# Patient Record
Sex: Male | Born: 1995 | Race: White | Hispanic: No | Marital: Single | Smoking: Never smoker
Health system: Southern US, Academic
[De-identification: ages and names within clinical notes are randomized; demographics above are authoritative.]

## PROBLEM LIST (undated history)

## (undated) ENCOUNTER — Emergency Department (HOSPITAL_COMMUNITY): Discharge status: Absent without leave | Payer: Self-pay

## (undated) DIAGNOSIS — J9383 Other pneumothorax: Secondary | ICD-10-CM

## (undated) HISTORY — PX: PLEURAL SCARIFICATION: SHX748

## (undated) HISTORY — DX: Other pneumothorax: J93.83

---

## 2015-08-14 DIAGNOSIS — J9383 Other pneumothorax: Secondary | ICD-10-CM

## 2015-08-14 HISTORY — DX: Other pneumothorax: J93.83

## 2015-08-18 ENCOUNTER — Inpatient Hospital Stay (HOSPITAL_COMMUNITY)
Admission: EM | Admit: 2015-08-18 | Discharge: 2015-08-26 | DRG: 164 | Disposition: A | Discharge status: Home / Self Care | Payer: 59 | Attending: Thoracic Surgery (Cardiothoracic Vascular Surgery) | Admitting: Thoracic Surgery (Cardiothoracic Vascular Surgery)

## 2015-08-18 ENCOUNTER — Inpatient Hospital Stay (HOSPITAL_COMMUNITY): Payer: 59

## 2015-08-18 ENCOUNTER — Encounter (HOSPITAL_COMMUNITY): Payer: Self-pay | Admitting: Cardiology

## 2015-08-18 ENCOUNTER — Emergency Department (HOSPITAL_COMMUNITY): Payer: 59

## 2015-08-18 DIAGNOSIS — J9382 Other air leak: Secondary | ICD-10-CM | POA: Diagnosis not present

## 2015-08-18 DIAGNOSIS — J9311 Primary spontaneous pneumothorax: Secondary | ICD-10-CM | POA: Diagnosis not present

## 2015-08-18 DIAGNOSIS — J939 Pneumothorax, unspecified: Secondary | ICD-10-CM

## 2015-08-18 DIAGNOSIS — J9383 Other pneumothorax: Secondary | ICD-10-CM | POA: Diagnosis present

## 2015-08-18 DIAGNOSIS — R0602 Shortness of breath: Secondary | ICD-10-CM | POA: Diagnosis present

## 2015-08-18 DIAGNOSIS — J930 Spontaneous tension pneumothorax: Secondary | ICD-10-CM

## 2015-08-18 DIAGNOSIS — J90 Pleural effusion, not elsewhere classified: Secondary | ICD-10-CM | POA: Diagnosis not present

## 2015-08-18 LAB — CBC
HCT: 45 % (ref 39.0–52.0)
Hemoglobin: 15.3 g/dL (ref 13.0–17.0)
MCH: 27.9 pg (ref 26.0–34.0)
MCHC: 34 g/dL (ref 30.0–36.0)
MCV: 82 fL (ref 78.0–100.0)
Platelets: 223 K/uL (ref 150–400)
RBC: 5.49 MIL/uL (ref 4.22–5.81)
RDW: 12.6 % (ref 11.5–15.5)
WBC: 7.9 K/uL (ref 4.0–10.5)

## 2015-08-18 LAB — CREATININE, SERUM
Creatinine, Ser: 1.18 mg/dL (ref 0.61–1.24)
GFR calc non Af Amer: >60 mL/min (ref >60)

## 2015-08-18 MED ORDER — MORPHINE SULFATE (PF) 4 MG/ML IV SOLN
4.0000 mg | Freq: Once | INTRAVENOUS | Status: AC
  Administered 2015-08-18: 4 mg via INTRAVENOUS
  Filled 2015-08-18: qty 1

## 2015-08-18 MED ORDER — ACETAMINOPHEN 325 MG PO TABS: 650.0000 mg | ORAL_TABLET | Freq: Four times a day (QID) | ORAL | Status: DC | PRN

## 2015-08-18 MED ORDER — TRAMADOL HCL 50 MG PO TABS
50.0000 mg | ORAL_TABLET | Freq: Four times a day (QID) | ORAL | Status: DC | PRN
  Administered 2015-08-18 – 2015-08-23 (×4): 100 mg via ORAL
  Filled 2015-08-18 (×4): qty 2

## 2015-08-18 MED ORDER — DIPHENHYDRAMINE HCL 50 MG/ML IJ SOLN: 12.5000 mg | Freq: Four times a day (QID) | INTRAMUSCULAR | Status: DC | PRN

## 2015-08-18 MED ORDER — OXYCODONE HCL 5 MG PO TABS
5.0000 mg | ORAL_TABLET | ORAL | Status: DC | PRN
  Administered 2015-08-18 – 2015-08-22 (×8): 5 mg via ORAL
  Filled 2015-08-18 (×8): qty 1

## 2015-08-18 MED ORDER — MIDAZOLAM HCL 2 MG/2ML IJ SOLN
4.0000 mg | Freq: Once | INTRAMUSCULAR | Status: DC
  Filled 2015-08-18: qty 4

## 2015-08-18 MED ORDER — DIPHENHYDRAMINE HCL 12.5 MG/5ML PO ELIX: 12.5000 mg | ORAL_SOLUTION | Freq: Four times a day (QID) | ORAL | Status: DC | PRN

## 2015-08-18 MED ORDER — SODIUM CHLORIDE 0.9 % IJ SOLN
3.0000 mL | Freq: Two times a day (BID) | INTRAMUSCULAR | Status: DC
  Administered 2015-08-18 – 2015-08-22 (×8): 3 mL via INTRAVENOUS

## 2015-08-18 MED ORDER — MORPHINE SULFATE (PF) 2 MG/ML IV SOLN: 2.0000 mg | INTRAVENOUS | Status: DC | PRN

## 2015-08-18 MED ORDER — ENOXAPARIN SODIUM 40 MG/0.4ML ~~LOC~~ SOLN
40.0000 mg | SUBCUTANEOUS | Status: DC
  Administered 2015-08-18 – 2015-08-22 (×5): 40 mg via SUBCUTANEOUS
  Filled 2015-08-18 (×5): qty 0.4

## 2015-08-18 MED ORDER — SODIUM CHLORIDE 0.9 % IJ SOLN
3.0000 mL | Freq: Two times a day (BID) | INTRAMUSCULAR | Status: DC
  Administered 2015-08-18 – 2015-08-22 (×4): 3 mL via INTRAVENOUS

## 2015-08-18 MED ORDER — NALOXONE HCL 0.4 MG/ML IJ SOLN: 0.4000 mg | INTRAMUSCULAR | Status: DC | PRN

## 2015-08-18 MED ORDER — ONDANSETRON HCL 4 MG PO TABS: 4.0000 mg | ORAL_TABLET | Freq: Four times a day (QID) | ORAL | Status: DC | PRN

## 2015-08-18 MED ORDER — FENTANYL 40 MCG/ML IV SOLN
INTRAVENOUS | Status: DC
  Filled 2015-08-18: qty 25

## 2015-08-18 MED ORDER — ACETAMINOPHEN 650 MG RE SUPP: 650.0000 mg | Freq: Four times a day (QID) | RECTAL | Status: DC | PRN

## 2015-08-18 MED ORDER — LIDOCAINE HCL (PF) 1 % IJ SOLN
30.0000 mL | Freq: Once | INTRAMUSCULAR | Status: AC
Start: 2015-08-18 — End: 2015-08-18
  Administered 2015-08-18: 30 mL
  Filled 2015-08-18: qty 30

## 2015-08-18 MED ORDER — SODIUM CHLORIDE 0.9 % IV SOLN: 250.0000 mL | INTRAVENOUS | Status: DC | PRN

## 2015-08-18 MED ORDER — MIDAZOLAM HCL 2 MG/2ML IJ SOLN
INTRAMUSCULAR | Status: AC | PRN
  Administered 2015-08-18: 2 mg via INTRAVENOUS

## 2015-08-18 MED ORDER — ONDANSETRON HCL 4 MG/2ML IJ SOLN: 4.0000 mg | Freq: Four times a day (QID) | INTRAMUSCULAR | Status: DC | PRN

## 2015-08-18 MED ORDER — SODIUM CHLORIDE 0.9 % IJ SOLN: 9.0000 mL | INTRAMUSCULAR | Status: DC | PRN

## 2015-08-18 MED ORDER — SODIUM CHLORIDE 0.9 % IJ SOLN: 3.0000 mL | INTRAMUSCULAR | Status: DC | PRN

## 2015-08-18 MED ORDER — ALUM & MAG HYDROXIDE-SIMETH 200-200-20 MG/5ML PO SUSP: 30.0000 mL | Freq: Four times a day (QID) | ORAL | Status: DC | PRN

## 2015-08-18 NOTE — ED Provider Notes (Signed)
CSN: [id number]     Arrival date & time 08/18/15  4098 History   First MD Initiated Contact with Patient 08/18/15 [phone]     Chief Complaint  Patient presents with  . Chest Injury     (Consider location/radiation/quality/duration/timing/severity/associated sxs/prior Treatment) HPI Patient presents to the emergency department with sudden onset of right-sided chest pain yesterday while in class.  He states he went to an urgent care today and was found to have a right-sided pneumothorax.  He was sent here to the emergency department with a report from that facility that just stated that he had a right-sided pneumothorax.  The patient states he did not have any injury or cough.  The patient denies weakness, dizziness, headache, blurred vision, back pain, neck pain, fever, cough, runny nose, sore throat, or syncope.  He should states nothing seems to make his condition, better or worse History reviewed. No pertinent past medical history. History reviewed. No pertinent past surgical history. History reviewed. No pertinent family history. Social History  Substance Use Topics  . Smoking status: Never Smoker   . Smokeless tobacco: None  . Alcohol Use: No    Review of Systems All other systems negative except as documented in the HPI. All pertinent positives and negatives as reviewed in the HPI.   Allergies  Review of patient's allergies indicates no known allergies.  Home Medications   Prior to Admission medications   Medication Sig Start Date End Date Taking? Authorizing Provider  ibuprofen (ADVIL,MOTRIN) 200 MG tablet Take 600 mg by mouth every 6 (six) hours as needed for moderate pain.   Yes Historical Provider, MD   BP 133/96 mmHg  Pulse 115  Temp(Src) 98.8 F (37.1 C) (Oral)  Resp 22  Ht 6' (1.829 m)  Wt 170 lb (77.111 kg)  BMI 23.05 kg/m2  SpO2 97% Physical Exam  Constitutional: He is oriented to person, place, and time. He appears well-developed and well-nourished. No  distress.  HENT:  Head: Normocephalic and atraumatic.  Right Ear: External ear normal.  Mouth/Throat: No oropharyngeal exudate.  Eyes: Pupils are equal, round, and reactive to light.  Neck: Normal range of motion. Neck supple.  Cardiovascular: Normal rate, regular rhythm and normal heart sounds.  Exam reveals no friction rub.   No murmur heard. Pulmonary/Chest: Effort normal. No tachypnea and no bradypnea. He has decreased breath sounds in the right upper field, the right middle field and the right lower field. He has no wheezes. He has no rhonchi. He has no rales.  Neurological: He is alert and oriented to person, place, and time. He exhibits normal muscle tone. Coordination normal.  Skin: Skin is warm and dry. No rash noted. No erythema.  Nursing note and vitals reviewed.   ED Course  Procedures (including critical care time) Labs Review Labs Reviewed - No data to display  Imaging Review Dg Chest 2 View  08/18/2015  CLINICAL DATA:  Chest pain EXAM: CHEST  2 VIEW COMPARISON:  None. FINDINGS: There is a complete pneumothorax on the right with tension component. Left lung clear. Heart size normal. Pulmonary vascularity on the left normal. No bone lesions. IMPRESSION: Complete pneumothorax on the right with tension component. Left lung clear. Critical Value/emergent results were called by telephone at the time of interpretation on 08/18/2015 at 10:38 am to [facility], [facility], who verbally acknowledged these results. Electronically Signed   By: Bretta Bang III M.D.   On: 08/18/2015 10:38   I have personally reviewed and evaluated these images  and lab results as part of my medical decision-making.     The thoracic surgeon was paged within an hour after the patient's arrival.  I spoke with an OR nurse and gave her the full details that the patient had a tension pneumothorax causing full collapse of his right lung.  She asked me what I would like for her to tell Dr. Dorris FetchHendrickson, and I  said, he needs to come down and see the patient as soon as possible for correction of the pneumothorax.  3:20 PM Dr. Dorris FetchHendrickson shows up and advises us that he just found out about this 5 minutes prior to coming to the ER.  I advised him that I spoke with the OR nurse who relayed the message to him and he needed to come down as soon as possible.    The patient has been stable the entire visit here in the ER.  Patient is advised plan and all questions were answered     Charlestine NightChristopher Chaquana Nichols, PA-C 08/18/15 1528  Mancel BaleElliott Wentz, MD 08/18/15 1911

## 2015-08-18 NOTE — ED Notes (Signed)
Called x-ray to inform that patient needs to be taken.

## 2015-08-18 NOTE — ED Notes (Signed)
Pt to department via EMS- pt reports sudden onset of right sided chest pain and SOB while in class yesterday. [facility] [address] and had a chest x-ray which showed a right complete pneumo. Bp-132/92 Hr-110 Sats-99

## 2015-08-18 NOTE — Sedation Documentation (Signed)
PA at bedside.

## 2015-08-18 NOTE — Procedures (Signed)
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 He was found to have a 100% right pneumothorax Chest tube placement indicated for survival benefit and relief of symptoms. After informed consent, chest was prepped and draped Given 4 mg morphine and 2 mg versed IV with good effect 20 ml of 1% lidocaine used for local anesthetic 28 F chest tube placed anterior right chest + rush of air. Air leak from tube after placement Tolerated well  Louis SpareSteven C. Dorris FetchHendrickson, MD [facility] [phone]

## 2015-08-18 NOTE — ED Notes (Signed)
Talked with PA about pt waiting for chest tube. Informed we are waiting for cardiothoracic MD to arrive.

## 2015-08-18 NOTE — ED Notes (Signed)
Talked with ED provider about pt waiting for chest tube. Pt remains stable at this time, denies any SOB. Skin warm and dry.

## 2015-08-18 NOTE — ED Notes (Signed)
Cardothoracic MD at bedside.

## 2015-08-18 NOTE — H&P (Signed)
[address]       [place] [id number]             [phone]             HISTORY AND PHYSICAL     Name: Louis LovettSimon A OZH-YQMVbu-Loiselle DOB: 1996-04-07 18 y.o. 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  There was associated shortness of breath.  The pain persisted and worsened with limited activity such as walking across campus.  He denies fever, chills, cough, nausea or vomiting. He ultimately presented to Urgent Care where a chest x-ray was performed, showing a large right pneumothorax.  [facility] and x-ray in the ER revealed a 100% right pneumothorax.  TCTS was consulted and Dr. Dorris FetchHendrickson saw the patient.  A right sided chest tube was placed while in the ER and the patient is now being admitted for chest tube management.    Past History: History reviewed. No pertinent past medical history.   Surgical History: History reviewed. No pertinent past surgical history.   Social History: Social History   Social History  . Marital Status: Single    Spouse Name: N/A  . Number of Children: N/A  . Years of Education: N/A   Social History Main Topics  . Smoking status: Never Smoker   . Smokeless tobacco: None  . Alcohol Use: No  . Drug Use: No  . Sexual Activity: Not Asked   Other Topics Concern  . None   Social History Narrative  . None    Family History: History reviewed. No pertinent family history.   Allergies: No Known Allergies   Medications: Prior to Admission medications   Medication Sig Start Date End Date Taking? Authorizing Provider  ibuprofen (ADVIL,MOTRIN) 200 MG tablet Take 600 mg by mouth every 6 (six) hours as needed for moderate pain.   Yes Historical Provider, MD     Review of Systems:             Cardiac:   Chest Pain [ x ]  Resting SOB [ x ] Exertional SOB  [ x ]  Orthopnea [  ]   Pedal Edema [   ]    Palpitations [  ] Syncope  [  ]   Presyncope [   ]  General: Constitional: recent weight change [  ]; anorexia [  ]; fatigue [  ]; nausea [  ]; night sweats [  ]; fever [  ]; or chills [  ];  Dental: poor dentition[  ]; Last Dentist visit:   Eye : blurred vision [  ]; diplopia [   ]; vision changes [  ];  Amaurosis fugax[  ]; Resp: cough [  ];  wheezing[  ];  hemoptysis[  ]; shortness of breath[ x ]; paroxysmal nocturnal dyspnea[  ]; dyspnea on exertion[  ]; or orthopnea[  ];  GI:  gallstones[  ], vomiting[  ];  dysphagia[  ]; melena[  ];  hematochezia [  ]; heartburn[  ];   Hx of  Colonoscopy[  ]; GU: kidney stones [  ]; hematuria[  ];   dysuria [  ];  nocturia[  ];  history of     obstruction [  ];             Skin: rash, swelling[  ];, hair loss[  ];  peripheral edema[  ];  or itching[  ]; Musculosketetal: myalgias[  ];  joint swelling[  ];  joint erythema[  ];  joint pain[  ];  back pain[  ];  Heme/Lymph: bruising[  ];  bleeding[  ];  anemia[  ];  Neuro: TIA[  ];  headaches[  ];  stroke[  ];  vertigo[  ];  seizures[  ];   paresthesias[  ];  difficulty walking[  ];  Psych:depression[  ]; anxiety[  ];  Endocrine: diabetes[  ];  thyroid dysfunction[  ];  Immunizations: Flu [  ]; Pneumococcal[  ];  Other:   Physical Exam: Filed Vitals:   08/18/15 1605  BP: 130/89  Pulse: 112  Temp:   Resp: 22    General: No acute distress Heart: Regular rate and rhythm without murmurs, rubs, or gallops Lungs: Clear on left, decreased breath sounds on right Abdomen: Soft, nontender, nondistended.  Bowel sounds active in all quadrants. Neurologic: Grossly intact.  No obvious deficits. Extremities: Warm, well perfused Chest tube: No obvious air  leak   Labs: No results found for: WBC, HGB, HCT, MCV, PLT No results found for: INR, PROTIME No results found for: NA, K, CL, CO2, GLUCOSE, BUN, CREATININE, CALCIUM, GFRNONAA, GFRAA  Imaging: Dg Chest 2 View  08/18/2015  CLINICAL DATA:  Chest pain EXAM: CHEST  2 VIEW COMPARISON:  None. FINDINGS: There is a complete pneumothorax on the right with tension component. Left lung clear. Heart size normal. Pulmonary vascularity on the left normal. No bone lesions. IMPRESSION: Complete pneumothorax on the right with tension component. Left lung clear. Critical Value/emergent results were called by telephone at the time of interpretation on 08/18/2015 at 10:38 am to [facility], [facility], who verbally acknowledged these results. Electronically Signed   By: Bretta Bang III M.D.   On: 08/18/2015 10:38     Assessment/Plan: The patient is a 19 year old male with no significant past medical history who presents with a spontaneous right pneumothorax.  A chest tube has been placed and the patient is being admitted for chest tube management.    Adella Hare, PA-C 08/18/2015 4:13 PM   Patient seen and examined, agree with above. 19 yo male nonsmoker developed CP and SOB yesterday. Came to ED this AM- CXR showed a 100% pneumothorax.  Salvatore Decent Dorris Fetch, MD [facility] [phone]

## 2015-08-19 ENCOUNTER — Inpatient Hospital Stay (HOSPITAL_COMMUNITY): Payer: 59

## 2015-08-19 LAB — COMPREHENSIVE METABOLIC PANEL
ALBUMIN: 4.1 g/dL (ref 3.5–5.0)
ALK PHOS: 57 U/L (ref 38–126)
ALT: 16 U/L — AB (ref 17–63)
AST: 17 U/L (ref 15–41)
Anion gap: 11 (ref 5–15)
BILIRUBIN TOTAL: 1.1 mg/dL (ref 0.3–1.2)
BUN: 15 mg/dL (ref 6–20)
CALCIUM: 9.9 mg/dL (ref 8.9–10.3)
CO2: 28 mmol/L (ref 22–32)
CREATININE: 1.25 mg/dL — AB (ref 0.61–1.24)
Chloride: 101 mmol/L (ref 101–111)
GFR calc Af Amer: >60 mL/min (ref >60)
GFR calc non Af Amer: >60 mL/min (ref >60)
GLUCOSE: 103 mg/dL — AB (ref 65–99)
Potassium: 3.9 mmol/L (ref 3.5–5.1)
SODIUM: 140 mmol/L (ref 135–145)
TOTAL PROTEIN: 7.2 g/dL (ref 6.5–8.1)

## 2015-08-19 MED ORDER — INFLUENZA VAC SPLIT QUAD 0.5 ML IM SUSY
0.5000 mL | PREFILLED_SYRINGE | INTRAMUSCULAR | Status: DC
  Filled 2015-08-19: qty 0.5

## 2015-08-19 NOTE — Progress Notes (Addendum)
[address]       [place] [address]             [phone]               Subjective: Sore at CT site but controlled with po meds. Breathing stable.   Objective: Vital signs in last 24 hours: Patient Vitals for the past 24 hrs:  BP Temp Temp src Pulse Resp SpO2 Height Weight  08/19/15 0620 124/82 mmHg 97.6 F (36.4 C) Oral 85 18 100 % - -  08/18/15 2019 124/72 mmHg 98 F (36.7 C) Oral (!) 109 18 99 % - -  08/18/15 1836 133/74 mmHg 97.9 F (36.6 C) Oral (!) 106 20 99 % 6' (1.829 m) 174 lb 4.8 oz (79.062 kg)  08/18/15 1715 124/84 mmHg - - 86 22 97 % - -  08/18/15 1700 131/95 mmHg - - 85 21 99 % - -  08/18/15 1645 128/94 mmHg - - 86 (!) 30 99 % - -  08/18/15 1630 128/88 mmHg - - 89 25 96 % - -  08/18/15 1620 126/91 mmHg - Oral 95 25 100 % - -  08/18/15 1615 126/91 mmHg - - 89 25 100 % - -  08/18/15 1605 130/89 mmHg - - 112 22 100 % - -  08/18/15 1600 140/93 mmHg - - 111 24 100 % - -  08/18/15 1600 140/93 mmHg - - 110 24 100 % - -  08/18/15 1555 142/96 mmHg - - 114 23 100 % - -  08/18/15 1545 148/96 mmHg - - 106 23 98 % - -  08/18/15 1538 132/98 mmHg - - 98 (!) 28 97 % - -  08/18/15 1530 132/98 mmHg - - 102 (!) 28 100 % - -  08/18/15 1515 133/96 mmHg - - 115 22 97 % - -  08/18/15 1500 110/70 mmHg - - 90 24 97 % - -  08/18/15 1445 121/72 mmHg - - 95 19 95 % - -  08/18/15 1430 100/69 mmHg - - 92 24 97 % - -  08/18/15 1415 111/71 mmHg - - 87 18 95 % - -  08/18/15 1345 114/83 mmHg - - 95 23 98 % - -  08/18/15 1315 107/75 mmHg - - 86 24 96 % - -  08/18/15 1245 124/87 mmHg - - 99 22 97 % - -  08/18/15 1114 125/86 mmHg - - 101 22 98 % - -  08/18/15 1015 126/87 mmHg - - 91 25 95 % - -  08/18/15 1000 130/96 mmHg - - 94 (!) 27 97 % - -  08/18/15 0945 127/88 mmHg - - 100 24 97 % - -  08/18/15 0934 (!) 141/114 mmHg 98.8 F (37.1 C) Oral 113 20 96 % 6' (1.829 m) 170 lb (77.111 kg)   Current Weight  08/18/15 174 lb 4.8 oz (79.062 kg)      Intake/Output from previous day: 11/11 0701 - 11/12 0700 In: 580 [P.O.:480; I.V.:100] Out: 0     PHYSICAL EXAM:  Heart: RRR Lungs: Clear Chest tube: + air leak with cough    Lab Results: CBC: Recent Labs  08/18/15 1717  WBC 7.9  HGB 15.3  HCT 45.0  PLT 223   BMET:  Recent Labs  08/18/15 1717 08/19/15 0432  NA  --  140  K  --  3.9  CL  --  101  CO2  --  28  GLUCOSE  --  103*  BUN  --  15  CREATININE 1.18 1.25*  CALCIUM  --  9.9    PT/INR: No results for input(s): LABPROT, INR in the last 72 hours.    Assessment/Plan: S/P  R CT for spontaneous ptx- CXR improved with small residual ptx.  + air leak with cough.  CT was never placed on suction overnight. Will continue CT to water seal for now. Follow up CXR in am.    LOS: 1 day    COLLINS,GINA H 08/19/2015  He has an air leak with cough- will leave on water seal today  Viviann SpareSteven C. Dorris FetchHendrickson, MD [facility] [phone]

## 2015-08-20 ENCOUNTER — Inpatient Hospital Stay (HOSPITAL_COMMUNITY): Payer: 59

## 2015-08-20 NOTE — Progress Notes (Addendum)
       [address]       [place] [id number]             [phone]               Subjective: Breathing stable, pain controlled.    Objective: Vital signs in last 24 hours: Patient Vitals for the past 24 hrs:  BP Temp Temp src Pulse Resp SpO2  08/20/15 0550 (!) 147/91 mmHg 97.5 F (36.4 C) Oral 89 18 97 %  08/19/15 2113 130/77 mmHg 97.5 F (36.4 C) Oral 98 18 95 %  08/19/15 2055 - - - - - 96 %  08/19/15 1422 128/85 mmHg - Oral 72 19 99 %   Current Weight  08/18/15 174 lb 4.8 oz (79.062 kg)     Intake/Output from previous day: 11/12 0701 - 11/13 0700 In: 480 [P.O.:480] Out: -     PHYSICAL EXAM:  Heart: RRR Lungs: Decreased BS on R Chest tube: 1/7 air leak on suction    Lab Results: CBC: Recent Labs  08/18/15 1717  WBC 7.9  HGB 15.3  HCT 45.0  PLT 223   BMET:  Recent Labs  08/18/15 1717 08/19/15 0432  NA  --  140  K  --  3.9  CL  --  101  CO2  --  28  GLUCOSE  --  103*  BUN  --  15  CREATININE 1.18 1.25*  CALCIUM  --  9.9    PT/INR: No results for input(s): LABPROT, INR in the last 72 hours.  CXR: FINDINGS: A right thoracostomy tube is again identified. The right pneumothorax has increased in size, now large. There is no evidence of mediastinal shift.  Right basilar atelectasis again noted.  The cardiomediastinal silhouette is unremarkable.  The left lung is clear.  IMPRESSION: Increased size of right pneumothorax, now large. No mediastinal shift. Right thoracostomy tube appears unchanged.  Results were called by telephone at the time of interpretation on 08/20/2015 at 7:51 am to Fayrene FearingJames, nurse for this patient, who verbally acknowledged these results.    Assessment/Plan: S/P  R CT for spontaneous ptx- CXR with enlarged ptx today, CT placed on -20 cm suction.  Follow up cxr in am.   LOS: 2 days    COLLINS,GINA H 08/20/2015  Lung failed to fully reexpand on water seal Will add suction as noted  above  Viviann SpareSteven C. Dorris FetchHendrickson, MD [facility] [phone]

## 2015-08-21 ENCOUNTER — Inpatient Hospital Stay (HOSPITAL_COMMUNITY): Payer: 59

## 2015-08-21 NOTE — Progress Notes (Signed)
UR Completed. Exa Bomba, RN, BSN.  [phone] 

## 2015-08-21 NOTE — Progress Notes (Addendum)
[name]       [name] [address]             [phone]          Subjective: Not SOB  Objective: Vital signs in last 24 hours: Temp:  [98 F (36.7 C)-99.2 F (37.3 C)] 98.6 F (37 C) (11/14 0547) Pulse Rate:  [80-107] 96 (11/14 0547) Cardiac Rhythm:  [-] Normal sinus rhythm (11/14 0739) Resp:  [17-18] 18 (11/14 0547) BP: (136-144)/(83-87) 136/85 mmHg (11/14 0547) SpO2:  [95 %-98 %] 95 % (11/14 0547)  Hemodynamic parameters for last 24 hours:    Intake/Output from previous day: 11/13 0701 - 11/14 0700 In: 240 [P.O.:240] Out: 0  Intake/Output this shift:    General appearance: alert, cooperative and no distress Heart: regular rate and rhythm Lungs: clear to auscultation bilaterally  Lab Results:  Recent Labs  08/18/15 1717  WBC 7.9  HGB 15.3  HCT 45.0  PLT 223   BMET:  Recent Labs  08/18/15 1717 08/19/15 0432  NA  --  140  K  --  3.9  CL  --  101  CO2  --  28  GLUCOSE  --  103*  BUN  --  15  CREATININE 1.18 1.25*  CALCIUM  --  9.9    PT/INR: No results for input(s): LABPROT, INR in the last 72 hours. ABG No results found for: PHART, HCO3, TCO2, ACIDBASEDEF, O2SAT CBG (last 3)  No results for input(s): GLUCAP in the last 72 hours.  Meds Scheduled Meds: . enoxaparin (LOVENOX) injection  40 mg Subcutaneous Q24H  . Influenza vac split quadrivalent PF  0.5 mL Intramuscular Tomorrow-1000  . sodium chloride  3 mL Intravenous Q12H  . sodium chloride  3 mL Intravenous Q12H   Continuous Infusions:  PRN Meds:.sodium chloride, acetaminophen **OR** acetaminophen, alum & mag hydroxide-simeth, ondansetron **OR** ondansetron (ZOFRAN) IV, oxyCODONE, sodium chloride, traMADol  Xrays Dg Chest Port 1 View  08/21/2015  CLINICAL DATA:  Follow-up pneumothorax, chest tube treatment. EXAM: PORTABLE CHEST 1 VIEW COMPARISON:  Portable chest x-ray of August 20, 2015 FINDINGS: There has been interval re-expansion of the right lung. A  less than 5% apical pneumothorax persists. There is small right pleural effusion and minimal right basilar atelectasis. The right-sided chest tube tip projects over the posterior aspect of the third rib. There is no mediastinal shift. The left lung is clear. The heart and pulmonary vascularity are normal the bony thorax is unremarkable. IMPRESSION: Interval near total re-expansion of the right lung following chest tube placement. A less than 5% pneumothorax remains. There is right basilar subsegmental atelectasis and small right pleural effusion. Electronically Signed   By: David  [place] M.D.   On: 08/21/2015 07:55   Dg Chest Port 1 View  08/20/2015  CLINICAL DATA:  19 year old male with spontaneous pneumothorax. EXAM: PORTABLE CHEST 1 VIEW COMPARISON:  None. FINDINGS: A right thoracostomy tube is again identified. The right pneumothorax has increased in size, now large. There is no evidence of mediastinal shift. Right basilar atelectasis again noted. The cardiomediastinal silhouette is unremarkable. The left lung is clear. IMPRESSION: Increased size of right pneumothorax, now large. No mediastinal shift. Right thoracostomy tube appears unchanged. Results were called by telephone at the time of interpretation on 08/20/2015 at 7:51 am to Fayrene Fearing, nurse for this patient, who verbally acknowledged these results. Electronically Signed   By: Harmon Pier M.D.   On: 08/20/2015 07:53    Assessment/Plan:  1 small pntx (5%) and small intermit air leak- cont to suction at 20 cm H20 for now    LOS: 3 days    Louis [name],Louis [name] 08/21/2015  Patient seen and examined, agree with above Chest xray better on suction Still has an air leak- if doesn't resolve by tomorrow will consider VATS  Viviann SpareSteven C. Dorris FetchHendrickson, MD [facility] [phone]

## 2015-08-22 ENCOUNTER — Inpatient Hospital Stay (HOSPITAL_COMMUNITY): Payer: 59

## 2015-08-22 LAB — TYPE AND SCREEN
ABO/RH(D): O POS
ANTIBODY SCREEN: NEGATIVE

## 2015-08-22 MED ORDER — DEXTROSE 5 % IV SOLN
1.5000 g | INTRAVENOUS | Status: AC
  Administered 2015-08-23: 1.5 g via INTRAVENOUS
  Filled 2015-08-22 (×2): qty 1.5

## 2015-08-22 NOTE — Progress Notes (Addendum)
      [name]       [place] [id number]             [phone]          Subjective: Feels pretty well, not SOB  Objective: Vital signs in last 24 hours: Temp:  [97.8 F (36.6 C)] 97.8 F (36.6 C) (11/15 0357) Pulse Rate:  [76-92] 82 (11/15 0357) Cardiac Rhythm:  [-] Normal sinus rhythm (11/15 0700) Resp:  [18-20] 18 (11/15 0357) BP: (124-146)/(81-97) 124/84 mmHg (11/15 0357) SpO2:  [97 %-98 %] 97 % (11/15 0357)  Hemodynamic parameters for last 24 hours:    Intake/Output from previous day: 11/14 0701 - 11/15 0700 In: 480 [P.O.:480] Out: 10 [Chest Tube:10] Intake/Output this shift:    General appearance: alert, cooperative and no distress Heart: regular rate and rhythm Lungs: clear to auscultation bilaterally Abdomen: benign  Lab Results: No results for input(s): WBC, HGB, HCT, PLT in the last 72 hours. BMET: No results for input(s): NA, K, CL, CO2, GLUCOSE, BUN, CREATININE, CALCIUM in the last 72 hours.  PT/INR: No results for input(s): LABPROT, INR in the last 72 hours. ABG No results found for: PHART, HCO3, TCO2, ACIDBASEDEF, O2SAT CBG (last 3)  No results for input(s): GLUCAP in the last 72 hours.  Meds Scheduled Meds: . enoxaparin (LOVENOX) injection  40 mg Subcutaneous Q24H  . Influenza vac split quadrivalent PF  0.5 mL Intramuscular Tomorrow-1000  . sodium chloride  3 mL Intravenous Q12H  . sodium chloride  3 mL Intravenous Q12H   Continuous Infusions:  PRN Meds:.sodium chloride, acetaminophen **OR** acetaminophen, alum & mag hydroxide-simeth, ondansetron **OR** ondansetron (ZOFRAN) IV, oxyCODONE, sodium chloride, traMADol  Xrays Dg Chest Port 1 View  08/21/2015  CLINICAL DATA:  Follow-up pneumothorax, chest tube treatment. EXAM: PORTABLE CHEST 1 VIEW COMPARISON:  Portable chest x-ray of August 20, 2015 FINDINGS: There has been interval re-expansion of the right lung. A less than 5% apical pneumothorax persists. There is small  right pleural effusion and minimal right basilar atelectasis. The right-sided chest tube tip projects over the posterior aspect of the third rib. There is no mediastinal shift. The left lung is clear. The heart and pulmonary vascularity are normal the bony thorax is unremarkable. IMPRESSION: Interval near total re-expansion of the right lung following chest tube placement. A less than 5% pneumothorax remains. There is right basilar subsegmental atelectasis and small right pleural effusion. Electronically Signed   By: David  [place] M.D.   On: 08/21/2015 07:55    Assessment/Plan:  1 stable, CXR is pending, + air leak(small) persists   LOS: 4 days    Louis [name],Louis [name] 08/22/2015  Patient seen and examined, agree with above He still has a small air leak At this point will give him one more day for air leak to resolve. If doesn't resolve by tomorrow will plan to do VATS, blebectomy tomorrow afternoon I informed him of the general nature of the procedure including the need for general anesthesia, the incisions to be used, the expected hospital stay and overall recovery I reviewed the indications, risks, benefits and alternatives. He understands the risks include but are not limited to death, DVT/PE, bleeding, infection, air leak and recurrent pneumothorax. He agrees to proceed.  Salvatore DecentSteven C. Dorris FetchHendrickson, MD [facility] [phone]

## 2015-08-23 ENCOUNTER — Inpatient Hospital Stay (HOSPITAL_COMMUNITY): Payer: 59 | Admitting: Certified Registered"

## 2015-08-23 ENCOUNTER — Inpatient Hospital Stay (HOSPITAL_COMMUNITY): Payer: 59

## 2015-08-23 ENCOUNTER — Encounter (HOSPITAL_COMMUNITY): Payer: Self-pay | Admitting: Certified Registered"

## 2015-08-23 ENCOUNTER — Encounter (HOSPITAL_COMMUNITY)
Admission: EM | Disposition: A | Discharge status: Home / Self Care | Payer: Self-pay | Source: Home / Self Care | Attending: Thoracic Surgery (Cardiothoracic Vascular Surgery)

## 2015-08-23 HISTORY — PX: PLEURAL BIOPSY: SHX5082

## 2015-08-23 HISTORY — PX: STAPLING OF BLEBS: SHX6429

## 2015-08-23 HISTORY — PX: VIDEO ASSISTED THORACOSCOPY: SHX5073

## 2015-08-23 LAB — MRSA PCR SCREENING: MRSA BY PCR: NEGATIVE

## 2015-08-23 LAB — ABO/RH: ABO/RH(D): O POS

## 2015-08-23 SURGERY — VIDEO ASSISTED THORACOSCOPY
Anesthesia: General | Site: Chest | Laterality: Right

## 2015-08-23 MED ORDER — 0.9 % SODIUM CHLORIDE (POUR BTL) OPTIME
TOPICAL | Status: DC | PRN
  Administered 2015-08-23: 2000 mL

## 2015-08-23 MED ORDER — SENNOSIDES-DOCUSATE SODIUM 8.6-50 MG PO TABS
1.0000 | ORAL_TABLET | Freq: Every day | ORAL | Status: DC
  Administered 2015-08-23 – 2015-08-25 (×3): 1 via ORAL
  Filled 2015-08-23 (×3): qty 1

## 2015-08-23 MED ORDER — HYDROMORPHONE HCL 1 MG/ML IJ SOLN
0.2500 mg | INTRAMUSCULAR | Status: DC | PRN
  Administered 2015-08-23 (×4): 0.5 mg via INTRAVENOUS

## 2015-08-23 MED ORDER — MIDAZOLAM HCL 2 MG/2ML IJ SOLN
INTRAMUSCULAR | Status: AC
  Filled 2015-08-23: qty 2

## 2015-08-23 MED ORDER — KCL IN DEXTROSE-NACL 20-5-0.45 MEQ/L-%-% IV SOLN
INTRAVENOUS | Status: DC
  Administered 2015-08-23 – 2015-08-24 (×3): via INTRAVENOUS
  Filled 2015-08-23 (×3): qty 1000

## 2015-08-23 MED ORDER — VECURONIUM BROMIDE 10 MG IV SOLR
INTRAVENOUS | Status: DC | PRN
  Administered 2015-08-23: 2 mg via INTRAVENOUS

## 2015-08-23 MED ORDER — ONDANSETRON HCL 4 MG/2ML IJ SOLN: 4.0000 mg | Freq: Four times a day (QID) | INTRAMUSCULAR | Status: DC | PRN

## 2015-08-23 MED ORDER — DIPHENHYDRAMINE HCL 50 MG/ML IJ SOLN: 12.5000 mg | Freq: Four times a day (QID) | INTRAMUSCULAR | Status: DC | PRN

## 2015-08-23 MED ORDER — ONDANSETRON HCL 4 MG/2ML IJ SOLN
INTRAMUSCULAR | Status: AC
  Filled 2015-08-23: qty 2

## 2015-08-23 MED ORDER — DIPHENHYDRAMINE HCL 12.5 MG/5ML PO ELIX: 12.5000 mg | ORAL_SOLUTION | Freq: Four times a day (QID) | ORAL | Status: DC | PRN

## 2015-08-23 MED ORDER — PROPOFOL 10 MG/ML IV BOLUS
INTRAVENOUS | Status: DC | PRN
  Administered 2015-08-23: 20 mg via INTRAVENOUS
  Administered 2015-08-23: 160 mg via INTRAVENOUS
  Administered 2015-08-23: 40 mg via INTRAVENOUS

## 2015-08-23 MED ORDER — HYDROMORPHONE HCL 1 MG/ML IJ SOLN
INTRAMUSCULAR | Status: AC
  Filled 2015-08-23: qty 1

## 2015-08-23 MED ORDER — ACETAMINOPHEN 325 MG PO TABS: 325.0000 mg | ORAL_TABLET | ORAL | Status: DC | PRN

## 2015-08-23 MED ORDER — FENTANYL CITRATE (PF) 250 MCG/5ML IJ SOLN
INTRAMUSCULAR | Status: DC | PRN
  Administered 2015-08-23: 200 ug via INTRAVENOUS
  Administered 2015-08-23 (×3): 50 ug via INTRAVENOUS

## 2015-08-23 MED ORDER — TRAMADOL HCL 50 MG PO TABS: 50.0000 mg | ORAL_TABLET | Freq: Four times a day (QID) | ORAL | Status: DC | PRN

## 2015-08-23 MED ORDER — ROCURONIUM BROMIDE 100 MG/10ML IV SOLN
INTRAVENOUS | Status: DC | PRN
  Administered 2015-08-23: 50 mg via INTRAVENOUS

## 2015-08-23 MED ORDER — MIDAZOLAM HCL 5 MG/5ML IJ SOLN
INTRAMUSCULAR | Status: DC | PRN
  Administered 2015-08-23: 2 mg via INTRAVENOUS

## 2015-08-23 MED ORDER — CEFUROXIME SODIUM 1.5 G IJ SOLR
1.5000 g | Freq: Two times a day (BID) | INTRAMUSCULAR | Status: AC
  Administered 2015-08-24 (×2): 1.5 g via INTRAVENOUS
  Filled 2015-08-23 (×4): qty 1.5

## 2015-08-23 MED ORDER — GLYCOPYRROLATE 0.2 MG/ML IJ SOLN
INTRAMUSCULAR | Status: DC | PRN
  Administered 2015-08-23: 0.6 mg via INTRAVENOUS

## 2015-08-23 MED ORDER — FENTANYL 40 MCG/ML IV SOLN
INTRAVENOUS | Status: DC
  Administered 2015-08-23: 17:00:00 via INTRAVENOUS
  Administered 2015-08-24: 30 ug via INTRAVENOUS
  Administered 2015-08-24: 45 ug via INTRAVENOUS
  Administered 2015-08-24: 0 ug via INTRAVENOUS
  Administered 2015-08-24: 30 ug via INTRAVENOUS
  Administered 2015-08-25: 15 ug via INTRAVENOUS
  Administered 2015-08-25: 45 ug via INTRAVENOUS
  Filled 2015-08-23: qty 25

## 2015-08-23 MED ORDER — ACETAMINOPHEN 500 MG PO TABS
1000.0000 mg | ORAL_TABLET | Freq: Four times a day (QID) | ORAL | Status: DC
  Administered 2015-08-23 – 2015-08-26 (×10): 1000 mg via ORAL
  Filled 2015-08-23 (×13): qty 2

## 2015-08-23 MED ORDER — FENTANYL CITRATE (PF) 250 MCG/5ML IJ SOLN
INTRAMUSCULAR | Status: AC
  Filled 2015-08-23: qty 5

## 2015-08-23 MED ORDER — NALOXONE HCL 0.4 MG/ML IJ SOLN: 0.4000 mg | INTRAMUSCULAR | Status: DC | PRN

## 2015-08-23 MED ORDER — VECURONIUM BROMIDE 10 MG IV SOLR
INTRAVENOUS | Status: AC
  Filled 2015-08-23: qty 10

## 2015-08-23 MED ORDER — POTASSIUM CHLORIDE 10 MEQ/50ML IV SOLN: 10.0000 meq | Freq: Every day | INTRAVENOUS | Status: DC | PRN

## 2015-08-23 MED ORDER — ROCURONIUM BROMIDE 50 MG/5ML IV SOLN
INTRAVENOUS | Status: AC
  Filled 2015-08-23: qty 1

## 2015-08-23 MED ORDER — STERILE WATER FOR INJECTION IJ SOLN
INTRAMUSCULAR | Status: AC
  Filled 2015-08-23: qty 10

## 2015-08-23 MED ORDER — ACETAMINOPHEN 160 MG/5ML PO SOLN
325.0000 mg | ORAL | Status: DC | PRN
  Filled 2015-08-23: qty 20.3

## 2015-08-23 MED ORDER — ACETAMINOPHEN 160 MG/5ML PO SOLN
1000.0000 mg | Freq: Four times a day (QID) | ORAL | Status: DC
  Administered 2015-08-24 – 2015-08-25 (×2): 1000 mg via ORAL

## 2015-08-23 MED ORDER — SODIUM CHLORIDE 0.9 % IJ SOLN: 9.0000 mL | INTRAMUSCULAR | Status: DC | PRN

## 2015-08-23 MED ORDER — OXYCODONE HCL 5 MG/5ML PO SOLN: 5.0000 mg | Freq: Once | ORAL | Status: DC | PRN

## 2015-08-23 MED ORDER — LACTATED RINGERS IV SOLN
INTRAVENOUS | Status: DC | PRN
  Administered 2015-08-23 (×2): via INTRAVENOUS

## 2015-08-23 MED ORDER — NEOSTIGMINE METHYLSULFATE 10 MG/10ML IV SOLN
INTRAVENOUS | Status: DC | PRN
  Administered 2015-08-23: 1 mg via INTRAVENOUS
  Administered 2015-08-23: 4 mg via INTRAVENOUS

## 2015-08-23 MED ORDER — BISACODYL 5 MG PO TBEC
10.0000 mg | DELAYED_RELEASE_TABLET | Freq: Every day | ORAL | Status: DC
  Administered 2015-08-23 – 2015-08-26 (×4): 10 mg via ORAL
  Filled 2015-08-23 (×3): qty 2

## 2015-08-23 MED ORDER — PROPOFOL 10 MG/ML IV BOLUS
INTRAVENOUS | Status: AC
  Filled 2015-08-23: qty 20

## 2015-08-23 MED ORDER — OXYCODONE HCL 5 MG PO TABS
5.0000 mg | ORAL_TABLET | ORAL | Status: DC | PRN
  Administered 2015-08-23 – 2015-08-24 (×2): 5 mg via ORAL
  Filled 2015-08-23 (×2): qty 1

## 2015-08-23 MED ORDER — ONDANSETRON HCL 4 MG/2ML IJ SOLN
INTRAMUSCULAR | Status: DC | PRN
  Administered 2015-08-23: 4 mg via INTRAVENOUS

## 2015-08-23 MED ORDER — OXYCODONE HCL 5 MG PO TABS: 5.0000 mg | ORAL_TABLET | Freq: Once | ORAL | Status: DC | PRN

## 2015-08-23 SURGICAL SUPPLY — 87 items
ADH SKN CLS APL DERMABOND .7 (GAUZE/BANDAGES/DRESSINGS)
APPLIER CLIP ROT 10 11.4 M/L (STAPLE)
APR CLP MED LRG 11.4X10 (STAPLE)
BAG SPEC RTRVL LRG 6X4 10 (ENDOMECHANICALS)
CANISTER SUCTION 2500CC (MISCELLANEOUS) ×3 IMPLANT
CATH KIT ON Q 5IN SLV (PAIN MANAGEMENT) IMPLANT
CATH THORACIC 28FR (CATHETERS) ×1 IMPLANT
CATH THORACIC 28FR RT ANG (CATHETERS) IMPLANT
CATH THORACIC 36FR (CATHETERS) IMPLANT
CATH THORACIC 36FR RT ANG (CATHETERS) IMPLANT
CLIP APPLIE ROT 10 11.4 M/L (STAPLE) IMPLANT
CLIP TI MEDIUM 6 (CLIP) IMPLANT
CONN Y 3/8X3/8X3/8  BEN (MISCELLANEOUS) ×1
CONN Y 3/8X3/8X3/8 BEN (MISCELLANEOUS) ×2 IMPLANT
CONT SPEC 4OZ CLIKSEAL STRL BL (MISCELLANEOUS) ×6 IMPLANT
CONT SPECI 4OZ STER CLIK (MISCELLANEOUS) ×2 IMPLANT
COVER SURGICAL LIGHT HANDLE (MISCELLANEOUS) ×3 IMPLANT
DERMABOND ADVANCED (GAUZE/BANDAGES/DRESSINGS)
DERMABOND ADVANCED .7 DNX12 (GAUZE/BANDAGES/DRESSINGS) IMPLANT
DRAIN CHANNEL 28F RND 3/8 FF (WOUND CARE) IMPLANT
DRAIN CHANNEL 32F RND 10.7 FF (WOUND CARE) IMPLANT
DRAPE LAPAROSCOPIC ABDOMINAL (DRAPES) ×3 IMPLANT
DRAPE WARM FLUID 44X44 (DRAPE) ×3 IMPLANT
ELECT BLADE 4.0 EZ CLEAN MEGAD (MISCELLANEOUS) ×3
ELECT REM PT RETURN 9FT ADLT (ELECTROSURGICAL) ×3
ELECTRODE BLDE 4.0 EZ CLN MEGD (MISCELLANEOUS) IMPLANT
ELECTRODE REM PT RTRN 9FT ADLT (ELECTROSURGICAL) ×2 IMPLANT
GAUZE PACKING IODOFORM 1/4X15 (GAUZE/BANDAGES/DRESSINGS) ×1 IMPLANT
GAUZE SPONGE 4X4 12PLY STRL (GAUZE/BANDAGES/DRESSINGS) ×3 IMPLANT
GLOVE BIO SURGEON STRL SZ 6 (GLOVE) ×1 IMPLANT
GLOVE BIOGEL PI IND STRL 6.5 (GLOVE) IMPLANT
GLOVE BIOGEL PI INDICATOR 6.5 (GLOVE) ×2
GLOVE SURG SIGNA 7.5 PF LTX (GLOVE) ×6 IMPLANT
GOWN STRL REUS W/ TWL LRG LVL3 (GOWN DISPOSABLE) ×4 IMPLANT
GOWN STRL REUS W/ TWL XL LVL3 (GOWN DISPOSABLE) ×2 IMPLANT
GOWN STRL REUS W/TWL LRG LVL3 (GOWN DISPOSABLE) ×6
GOWN STRL REUS W/TWL XL LVL3 (GOWN DISPOSABLE) ×3
HEMOSTAT SURGICEL 2X14 (HEMOSTASIS) IMPLANT
KIT BASIN OR (CUSTOM PROCEDURE TRAY) ×3 IMPLANT
KIT ROOM TURNOVER OR (KITS) ×3 IMPLANT
KIT SUCTION CATH 14FR (SUCTIONS) ×3 IMPLANT
NS IRRIG 1000ML POUR BTL (IV SOLUTION) ×6 IMPLANT
PACK CHEST (CUSTOM PROCEDURE TRAY) ×3 IMPLANT
PAD ARMBOARD 7.5X6 YLW CONV (MISCELLANEOUS) ×6 IMPLANT
POUCH ENDO CATCH II 15MM (MISCELLANEOUS) IMPLANT
POUCH SPECIMEN RETRIEVAL 10MM (ENDOMECHANICALS) IMPLANT
RELOAD STAPLE 60 3.8 GOLD REG (STAPLE) IMPLANT
RELOAD STAPLER GOLD 60MM (STAPLE) ×4 IMPLANT
SEALANT PROGEL (MISCELLANEOUS) IMPLANT
SEALANT SURG COSEAL 4ML (VASCULAR PRODUCTS) IMPLANT
SEALANT SURG COSEAL 8ML (VASCULAR PRODUCTS) IMPLANT
SOLUTION ANTI FOG 6CC (MISCELLANEOUS) ×3 IMPLANT
SPECIMEN JAR MEDIUM (MISCELLANEOUS) ×3 IMPLANT
SPONGE GAUZE 4X4 12PLY STER LF (GAUZE/BANDAGES/DRESSINGS) ×2 IMPLANT
SPONGE INTESTINAL PEANUT (DISPOSABLE) IMPLANT
SPONGE TONSIL 1 RF SGL (DISPOSABLE) ×3 IMPLANT
STAPLE ECHEON FLEX 60 POW ENDO (STAPLE) ×1 IMPLANT
STAPLER RELOAD GOLD 60MM (STAPLE) ×6
SUT PROLENE 4 0 RB 1 (SUTURE)
SUT PROLENE 4-0 RB1 .5 CRCL 36 (SUTURE) IMPLANT
SUT SILK  1 MH (SUTURE) ×2
SUT SILK 1 MH (SUTURE) ×4 IMPLANT
SUT SILK 2 0SH CR/8 30 (SUTURE) IMPLANT
SUT SILK 3 0SH CR/8 30 (SUTURE) IMPLANT
SUT VIC AB 1 CTX 36 (SUTURE) ×3
SUT VIC AB 1 CTX36XBRD ANBCTR (SUTURE) IMPLANT
SUT VIC AB 2-0 CT1 27 (SUTURE) ×3
SUT VIC AB 2-0 CT1 TAPERPNT 27 (SUTURE) IMPLANT
SUT VIC AB 2-0 CTX 36 (SUTURE) IMPLANT
SUT VIC AB 2-0 UR6 27 (SUTURE) IMPLANT
SUT VIC AB 3-0 MH 27 (SUTURE) IMPLANT
SUT VIC AB 3-0 X1 27 (SUTURE) ×3 IMPLANT
SUT VICRYL 2 TP 1 (SUTURE) IMPLANT
SWAB COLLECTION DEVICE MRSA (MISCELLANEOUS) IMPLANT
SYSTEM SAHARA CHEST DRAIN ATS (WOUND CARE) ×3 IMPLANT
TAPE CLOTH 4X10 WHT NS (GAUZE/BANDAGES/DRESSINGS) ×3 IMPLANT
TAPE CLOTH SURG 4X10 WHT LF (GAUZE/BANDAGES/DRESSINGS) ×1 IMPLANT
TIP APPLICATOR SPRAY EXTEND 16 (VASCULAR PRODUCTS) IMPLANT
TOWEL OR 17X24 6PK STRL BLUE (TOWEL DISPOSABLE) ×3 IMPLANT
TOWEL OR 17X26 10 PK STRL BLUE (TOWEL DISPOSABLE) ×6 IMPLANT
TRAP SPECIMEN MUCOUS 40CC (MISCELLANEOUS) IMPLANT
TRAY FOLEY CATH 16FRSI W/METER (SET/KITS/TRAYS/PACK) ×3 IMPLANT
TROCAR XCEL BLADELESS 5X75MML (TROCAR) ×3 IMPLANT
TROCAR XCEL NON-BLD 5MMX100MML (ENDOMECHANICALS) IMPLANT
TUBE ANAEROBIC SPECIMEN COL (MISCELLANEOUS) IMPLANT
TUNNELER SHEATH ON-Q 11GX8 DSP (PAIN MANAGEMENT) IMPLANT
WATER STERILE IRR 1000ML POUR (IV SOLUTION) ×6 IMPLANT

## 2015-08-23 NOTE — Progress Notes (Signed)
RN got pt out of bed to attempt to urinate. Pt unsuccessful and HR elevated to 160. Pt stated he had a lot of pain and PCA was encouraged. HR returned to ST 110s and call bell is within reach-pt in bed.   Will continue to monitor.   Louis RoysMcGrath, Moselle Rister R

## 2015-08-23 NOTE — Interval H&P Note (Signed)
History and Physical Interval Note: Persistent air leak Will proceed with Right VATS bleb resection  08/23/2015 1:53 PM  Louis [name]  has presented today for surgery, with the diagnosis of AIR LEAK  The various methods of treatment have been discussed with the patient and family. After consideration of risks, benefits and other options for treatment, the patient has consented to  Procedure(s): VIDEO ASSISTED THORACOSCOPY (Right) STAPLING OF BLEBS (Right) PLEURAL ABRASION (Right) as a surgical intervention .  The patient's history has been reviewed, patient examined, no change in status, stable for surgery.  I have reviewed the patient's chart and labs.  Questions were answered to the patient's satisfaction.     Loreli SlotSteven C Jaylah Goodlow

## 2015-08-23 NOTE — Progress Notes (Signed)
RN got pt to dangle at bedside. HR increased to 150 and O2 decreased to 90% on 3L. RN encouraged pt to take slow deep breaths. Linens were changed and family was educated concerning PCA pump. RN encouraged use of PCA and then helped pt return to bed. Pt lying with call bell in reach and states his pain is decreasing. Pt due to void post surgery.   Will continue to monitor.   Edgardo RoysMcGrath, Rylin Seavey R

## 2015-08-23 NOTE — Progress Notes (Signed)
      [address]       [place] [id number]             [phone]      Feels about the same  BP 120/72 mmHg  Pulse 77  Temp(Src) 98.1 F (36.7 C) (Oral)  Resp 18  Ht 6' (1.829 m)  Wt 174 lb 4.8 oz (79.062 kg)  BMI 23.63 kg/m2  SpO2 97%  CXR looks good, but air leak is unchanged.   Will proceed with right VATS, blebectomy later today  Viviann SpareSteven C. Dorris FetchHendrickson, MD [facility] [phone]

## 2015-08-23 NOTE — Anesthesia Procedure Notes (Signed)
Procedure Name: Intubation Date/Time: 08/23/2015 2:23 PM Performed by: Jerilee HohMUMM, Caidan Hubbert N Pre-anesthesia Checklist: Patient identified, Emergency Drugs available, Suction available and Patient being monitored Patient Re-evaluated:Patient Re-evaluated prior to inductionOxygen Delivery Method: Circle system utilized Preoxygenation: Pre-oxygenation with 100% oxygen Intubation Type: IV induction Ventilation: Mask ventilation without difficulty Laryngoscope Size: Mac and 4 Grade View: Grade I Endobronchial tube: Double lumen EBT and Left and 39 Fr Number of attempts: 1 Airway Equipment and Method: Stylet Placement Confirmation: ETT inserted through vocal cords under direct vision,  positive ETCO2 and breath sounds checked- equal and bilateral Tube secured with: Tape Dental Injury: Teeth and Oropharynx as per pre-operative assessment

## 2015-08-23 NOTE — Transfer of Care (Signed)
Immediate Anesthesia Transfer of Care Note  Patient: Louis [name]  Procedure(s) Performed: Procedure(s): VIDEO ASSISTED THORACOSCOPY (Right) STAPLING OF BLEBS (Right) PLEURAL ABRASION (Right)  Patient Location: PACU  Anesthesia Type:General  Level of Consciousness: awake, alert , oriented and patient cooperative  Airway & Oxygen Therapy: Patient Spontanous Breathing and Patient connected to nasal cannula oxygen  Post-op Assessment: Report given to RN, Post -op Vital signs reviewed and stable and Patient moving all extremities  Post vital signs: Reviewed and stable  Last Vitals:  Filed Vitals:   08/23/15 0539  BP: 120/72  Pulse: 77  Temp: 36.7 C  Resp: 18    Complications: No apparent anesthesia complications

## 2015-08-23 NOTE — Brief Op Note (Addendum)
08/18/2015 - 08/23/2015  3:39 PM  PATIENT:  Louis [name]  18 y.o. male  PRE-OPERATIVE DIAGNOSIS: RIGHT SPONTANEOUS PNEUMOTHORAX  POST-OPERATIVE DIAGNOSIS:  RIGHT SPONTANEOUS PNEUMOTHORAX  PROCEDURE:   RIGHT VIDEO ASSISTED THORACOSCOPY  RIGHT APICAL BLEB RESECTION  PLEURECTOMY  MECHANICAL PLEURODESIS  SURGEON:  Loreli SlotSteven C Hendrickson, MD  ASSISTANT: Coral CeoGina Collins, PA-C  ANESTHESIA:   general  SPECIMEN:  Source of Specimen:  Right apical bleb, parietal pleura  DISPOSITION OF SPECIMEN:  Pathology  DRAINS: 28 Fr CT  PATIENT CONDITION:  PACU - hemodynamically stable.  FINDINGS: 1 cm ruptured bleb at apex, lungs otherwise normal

## 2015-08-23 NOTE — Anesthesia Preprocedure Evaluation (Signed)
Anesthesia Evaluation  Patient identified by MRN, date of birth, ID band Patient awake    Reviewed: Allergy & Precautions, NPO status , Patient's Chart, lab work & pertinent test results  Airway Mallampati: II  TM Distance: >3 FB Neck ROM: Full    Dental  (+) Teeth Intact   Pulmonary  Spontaneous right PTX   breath sounds clear to auscultation       Cardiovascular negative cardio ROS   Rhythm:Regular     Neuro/Psych negative neurological ROS  negative psych ROS   GI/Hepatic negative GI ROS, Neg liver ROS,   Endo/Other  negative endocrine ROS  Renal/GU negative Renal ROS     Musculoskeletal   Abdominal   Peds  Hematology negative hematology ROS (+)   Anesthesia Other Findings   Reproductive/Obstetrics                             Anesthesia Physical Anesthesia Plan  ASA: II  Anesthesia Plan: General   Post-op Pain Management:    Induction: Intravenous  Airway Management Planned: Double Lumen EBT  Additional Equipment: None  Intra-op Plan:   Post-operative Plan: Extubation in OR  Informed Consent: I have reviewed the patients History and Physical, chart, labs and discussed the procedure including the risks, benefits and alternatives for the proposed anesthesia with the patient or authorized representative who has indicated his/her understanding and acceptance.   Dental advisory given  Plan Discussed with: CRNA and Surgeon  Anesthesia Plan Comments:         Anesthesia Quick Evaluation

## 2015-08-24 ENCOUNTER — Inpatient Hospital Stay (HOSPITAL_COMMUNITY): Payer: 59

## 2015-08-24 ENCOUNTER — Encounter (HOSPITAL_COMMUNITY): Payer: Self-pay | Admitting: Thoracic Surgery (Cardiothoracic Vascular Surgery)

## 2015-08-24 LAB — BLOOD GAS, ARTERIAL
ACID-BASE EXCESS: 3.2 mmol/L — AB (ref 0.0–2.0)
Bicarbonate: 27.6 mEq/L — ABNORMAL HIGH (ref 20.0–24.0)
Drawn by: 244861
O2 Content: 2 L/min
O2 SAT: 97.4 %
PATIENT TEMPERATURE: 98.6
TCO2: 29 mmol/L (ref 0–100)
pCO2 arterial: 45.4 mmHg — ABNORMAL HIGH (ref 35.0–45.0)
pH, Arterial: 7.401 (ref 7.350–7.450)
pO2, Arterial: 101 mmHg — ABNORMAL HIGH (ref 80.0–100.0)

## 2015-08-24 LAB — BASIC METABOLIC PANEL
ANION GAP: 10 (ref 5–15)
BUN: 15 mg/dL (ref 6–20)
CALCIUM: 9.9 mg/dL (ref 8.9–10.3)
CO2: 29 mmol/L (ref 22–32)
Chloride: 98 mmol/L — ABNORMAL LOW (ref 101–111)
Creatinine, Ser: 1.07 mg/dL (ref 0.61–1.24)
Glucose, Bld: 127 mg/dL — ABNORMAL HIGH (ref 65–99)
Potassium: 4.2 mmol/L (ref 3.5–5.1)
Sodium: 137 mmol/L (ref 135–145)

## 2015-08-24 LAB — CBC
HEMATOCRIT: 46.4 % (ref 39.0–52.0)
HEMOGLOBIN: 15.5 g/dL (ref 13.0–17.0)
MCH: 27.8 pg (ref 26.0–34.0)
MCHC: 33.4 g/dL (ref 30.0–36.0)
MCV: 83.2 fL (ref 78.0–100.0)
Platelets: 241 10*3/uL (ref 150–400)
RBC: 5.58 MIL/uL (ref 4.22–5.81)
RDW: 12.5 % (ref 11.5–15.5)
WBC: 14.4 10*3/uL — AB (ref 4.0–10.5)

## 2015-08-24 MED ORDER — KETOROLAC TROMETHAMINE 30 MG/ML IJ SOLN
30.0000 mg | Freq: Four times a day (QID) | INTRAMUSCULAR | Status: AC
  Administered 2015-08-24 – 2015-08-25 (×4): 30 mg via INTRAVENOUS
  Filled 2015-08-24 (×6): qty 1

## 2015-08-24 MED ORDER — ENOXAPARIN SODIUM 40 MG/0.4ML ~~LOC~~ SOLN
40.0000 mg | SUBCUTANEOUS | Status: DC
  Administered 2015-08-24: 40 mg via SUBCUTANEOUS
  Filled 2015-08-24: qty 0.4

## 2015-08-24 NOTE — Op Note (Signed)
Louis [name], Louis [name]              ACCOUNT NO.:  [id number]  MEDICAL RECORD NO.:  [id number]  LOCATION:  [id number]                        FACILITY:  [facility]  PHYSICIAN:  Salvatore Decent. Dorris Fetch, M.D.DATE OF BIRTH:  1996-09-22  DATE OF PROCEDURE:  08/23/2015 DATE OF DISCHARGE:                              OPERATIVE REPORT   PREOPERATIVE DIAGNOSIS:  Right spontaneous pneumothorax with persistent air leak.  POSTOPERATIVE DIAGNOSIS:  Right spontaneous pneumothorax secondary to ruptured apical bleb.  PROCEDURE:   Right video-assisted thoracoscopy Apical bleb resection Pleural stripping and abrasion.  SURGEON:  Salvatore Decent. Dorris Fetch, M.D.  ASSISTANT:  Coral Ceo, P.A.  ANESTHESIA:  General.  FINDINGS:  1 cm ruptured bleb at apex, remainder of the lung normal.  CLINICAL NOTE:  Louis [name] is a 19 year old nonsmoker, who presented with chest pain and shortness of breath.  He had 100% right pneumothorax.  A chest tube was placed in the emergency room.  Over the next several days, he had a persistent air leak and was advised to undergo right video-assisted thoracoscopy for apical bleb resection.  The indications, risks, benefits, and alternatives were discussed in detail with the patient.  He understood and accepted the risks and agreed to proceed.  OPERATIVE NOTE:  Louis [name] was brought to the preoperative holding area on August 23, 2015.  Anesthesia established intravenous access, and placed an arterial blood pressure monitoring line.  He was taken to the operating room, anesthetized, and intubated with a double-lumen endotracheal tube.  A Foley catheter was placed. It was removed at the end of the procedure.  Sequential compression devices were placed for DVT prophylaxis.  A warming blanket was placed.  Intravenous antibiotics were administered.  He was placed in a left lateral decubitus position, and the right chest was prepped and draped in the usual sterile  fashion. Single-lung ventilation of the left lung was initiated and was tolerated well throughout the procedure.  An incision was made in the sixth intercostal space in the anterior axillary line.  A 5 mm port was inserted into the chest.  The thoracoscope was advanced in the chest.  There was good isolation of the right lung.  A small working incision, 4 cm in length, was made in approximately the third interspace anterolaterally.  No rib spreading was performed during the procedure.  The lung was inspected.  There was a ruptured bleb at the apex.  Remainder of the lung appeared completely normal.  An apical bleb resection was performed with sequential firings of an endoscopic GIA stapler using the Ethicon powered stapler with tan cartridges.  The specimen was removed and sent for permanent pathology. The staple line was inspected.  There was good hemostasis.  The pleura at the apex then was stripped down to the level of the top of the third rib.  There was bleeding with this.  The remainder of the pleura was lightly abraded.  A 28-French chest tube was placed through the original port incision and secured with a #1 silk suture.  The right lung was reinflated.  There was good expansion of all 3 lobes.  The working incision was closed with a #1 Vicryl fascial suture, a 2-0 Vicryl subcutaneous suture,  and a 3-0 Vicryl subcuticular suture. He was placed back in a supine position.  The chest tube was placed to suction.  He was extubated in the operating room, and taken to the postanesthetic care unit in good condition.     Salvatore DecentSteven C. Dorris FetchHendrickson, M.D.     SCH/MEDQ  D:  08/23/2015  T:  08/24/2015  Job:  [id number]

## 2015-08-24 NOTE — Progress Notes (Signed)
Pt requested to ambulate to the bathroom to attempt to void. While standing HR increased as high as 175 SR, RN told pt to sit down and HR came down to 140s. Pt returned to bed and HR returned to ST 110s. Pt was asymptomatic and he stated he could not feel his heart beating quickly. O2 was at 90% with elevated HR.   Pt voided 30 mL and c/o burning and still feels he has the urge to void.   Will continue to monitor.   Edgardo RoysMcGrath, Jayen Bromwell R

## 2015-08-24 NOTE — Progress Notes (Addendum)
       [address]       ,[place] [id number]             [phone]          1 Day Post-Op Procedure(s) (LRB): VIDEO ASSISTED THORACOSCOPY (Right) STAPLING OF BLEBS (Right) PLEURAL ABRASION (Right)  Subjective: Breathing stable. Pain controlled mostly with po meds. No nausea.   Objective: Vital signs in last 24 hours: Patient Vitals for the past 24 hrs:  BP Temp Temp src Pulse Resp SpO2  08/24/15 0400 (!) 148/91 mmHg 98.5 F (36.9 C) Oral (!) 114 (!) 22 95 %  08/24/15 0044 (!) 144/93 mmHg - - - - -  08/24/15 0000 - - - - 20 97 %  08/23/15 2157 (!) 163/96 mmHg 99.3 F (37.4 C) Oral (!) 103 18 94 %  08/23/15 2000 - - - - 20 94 %  08/23/15 1809 (!) 147/91 mmHg 97.5 F (36.4 C) Oral (!) 106 18 95 %  08/23/15 1734 134/90 mmHg - - 89 11 95 %  08/23/15 1728 (!) 145/88 mmHg - - 91 16 95 %  08/23/15 1726 (!) 130/93 mmHg - - 89 19 96 %  08/23/15 1720 - 98.2 F (36.8 C) - - - -  08/23/15 1712 (!) 142/101 mmHg - - 87 (!) 22 95 %  08/23/15 1704 (!) 136/98 mmHg - - 85 19 95 %  08/23/15 1649 140/86 mmHg - - 79 18 94 %  08/23/15 1638 - - - - 20 93 %  08/23/15 1637 (!) 153/101 mmHg - - 96 (!) 39 92 %  08/23/15 1634 (!) 150/105 mmHg - - 93 (!) 23 93 %  08/23/15 1620 (!) 152/95 mmHg 97.7 F (36.5 C) - 94 (!) 23 92 %  08/23/15 1619 (!) 162/106 mmHg - - (!) 101 (!) 30 97 %   Current Weight  08/18/15 174 lb 4.8 oz (79.062 kg)     Intake/Output from previous day: 11/16 0701 - 11/17 0700 In: 1500 [I.V.:1500] Out: 2710 [Urine:2575; Blood:50; Chest Tube:85]    PHYSICAL EXAM:  Heart: RRR Lungs: Clear Wound: Clean and dry Chest tube: No air leak    Lab Results: CBC: Recent Labs  08/24/15 0425  WBC 14.4*  HGB 15.5  HCT 46.4  PLT 241   BMET:  Recent Labs  08/24/15 0425  NA 137  K 4.2  CL 98*  CO2 29  GLUCOSE 127*  BUN 15  CREATININE 1.07  CALCIUM 9.9    PT/INR: No results for input(s): LABPROT, INR in the last 72  hours.    Assessment/Plan: S/P Procedure(s) (LRB): VIDEO ASSISTED THORACOSCOPY (Right) STAPLING OF BLEBS (Right) PLEURAL ABRASION (Right) CXR stable with no obvious ptx, CT with no air leak. Hopefully can decrease to water seal. Pulm- encouraged pulm toilet/IS.  Wean O2. Mobilize as tolerated. BPs elevated overnight, possibly due to pain as he did not get much relief from PCA. He is mostly using po meds. Will add a few doses of Toradol for pain control.  Watch BPs closely.   LOS: 6 days    COLLINS,GINA H 08/24/2015  Patient seen and examined, agree with above CT to water seal Ambulate If CXR oK in AM and no air leak, will dc CT. Then home tomorrow PM or Saturday depending on how he feels.  Salvatore DecentSteven C. Dorris FetchHendrickson, MD [facility] [phone]

## 2015-08-24 NOTE — Anesthesia Postprocedure Evaluation (Signed)
  Anesthesia Post-op Note  Patient: Louis [name]  Procedure(s) Performed: Procedure(s): VIDEO ASSISTED THORACOSCOPY (Right) STAPLING OF BLEBS (Right) PLEURAL ABRASION (Right)  Patient Location: PACU  Anesthesia Type:General  Level of Consciousness: awake  Airway and Oxygen Therapy: Patient Spontanous Breathing  Post-op Pain: mild  Post-op Assessment: Post-op Vital signs reviewed, Patient's Cardiovascular Status Stable, Respiratory Function Stable, Patent Airway, No signs of Nausea or vomiting and Pain level controlled              Post-op Vital Signs: Reviewed and stable  Last Vitals:  Filed Vitals:   08/24/15 0800  BP:   Pulse:   Temp:   Resp: 18    Complications: No apparent anesthesia complications

## 2015-08-24 NOTE — Progress Notes (Signed)
Utilization review completed.  

## 2015-08-25 ENCOUNTER — Inpatient Hospital Stay (HOSPITAL_COMMUNITY): Payer: 59

## 2015-08-25 LAB — COMPREHENSIVE METABOLIC PANEL
ALT: 17 U/L (ref 17–63)
AST: 16 U/L (ref 15–41)
Albumin: 3.2 g/dL — ABNORMAL LOW (ref 3.5–5.0)
Alkaline Phosphatase: 40 U/L (ref 38–126)
Anion gap: 7 (ref 5–15)
BILIRUBIN TOTAL: 0.7 mg/dL (ref 0.3–1.2)
BUN: 17 mg/dL (ref 6–20)
CALCIUM: 9.2 mg/dL (ref 8.9–10.3)
CHLORIDE: 99 mmol/L — AB (ref 101–111)
CO2: 30 mmol/L (ref 22–32)
Creatinine, Ser: 1.18 mg/dL (ref 0.61–1.24)
Glucose, Bld: 137 mg/dL — ABNORMAL HIGH (ref 65–99)
Potassium: 3.9 mmol/L (ref 3.5–5.1)
Sodium: 136 mmol/L (ref 135–145)
Total Protein: 6.2 g/dL — ABNORMAL LOW (ref 6.5–8.1)

## 2015-08-25 LAB — CBC
HEMATOCRIT: 36.3 % — AB (ref 39.0–52.0)
Hemoglobin: 12.2 g/dL — ABNORMAL LOW (ref 13.0–17.0)
MCH: 28.2 pg (ref 26.0–34.0)
MCHC: 33.6 g/dL (ref 30.0–36.0)
MCV: 84 fL (ref 78.0–100.0)
PLATELETS: 219 10*3/uL (ref 150–400)
RBC: 4.32 MIL/uL (ref 4.22–5.81)
RDW: 12.4 % (ref 11.5–15.5)
WBC: 11.3 10*3/uL — ABNORMAL HIGH (ref 4.0–10.5)

## 2015-08-25 NOTE — Progress Notes (Signed)
Encouraged pt to walk.  States he does not want to walk at this time, "maybe in 30 minutes." [name], Jaran Sainz C

## 2015-08-25 NOTE — Progress Notes (Signed)
08/25/2015 1030 Chest Tube removed per order and protocol.  The clot noted outside the tube at the insertion site did come off the skin.  No rebleeding noted.   [name], Olis Viverette C

## 2015-08-25 NOTE — Progress Notes (Signed)
Pt ambulated 75 ft with RN on RA. Pt oxygen saturation before ambulation was 96%; after ambulation it was 94%. While walking pt c/o dizziness but denies SOB. Pt now resting in bed with call bell within reach. RN will continue to monitor.   Roselie AwkwardMegan, Carine Nordgren, RN

## 2015-08-25 NOTE — Progress Notes (Addendum)
       [address]       [place] [id number]             [phone]          2 Days Post-Op Procedure(s) (LRB): VIDEO ASSISTED THORACOSCOPY (Right) STAPLING OF BLEBS (Right) PLEURAL ABRASION (Right)  Subjective: C/o "burning" around CT site. Breathing stable.   Objective: Vital signs in last 24 hours: Patient Vitals for the past 24 hrs:  BP Temp Temp src Pulse Resp SpO2  08/25/15 0628 115/76 mmHg 98.6 F (37 C) Oral (!) 101 - 99 %  08/25/15 0400 - - - - (!) 22 95 %  08/24/15 2357 - - - - 19 95 %  08/24/15 2058 124/73 mmHg 97.6 F (36.4 C) Oral (!) 113 (!) 35 94 %  08/24/15 2000 - - - - 17 95 %  08/24/15 1541 - - - - 18 97 %  08/24/15 1300 134/84 mmHg 98.6 F (37 C) Oral 99 (!) 21 97 %  08/24/15 1200 - - - - 20 97 %  08/24/15 0800 - - - - 18 94 %   Current Weight  08/18/15 174 lb 4.8 oz (79.062 kg)     Intake/Output from previous day: 11/17 0701 - 11/18 0700 In: 600 [P.O.:600] Out: 1010 [Urine:950; Chest Tube:60]    PHYSICAL EXAM:  Heart: RRR Lungs: Clear Wound: Clean and dry Chest tube: No air leak    Lab Results: CBC: Recent Labs  08/24/15 0425 08/25/15 0405  WBC 14.4* 11.3*  HGB 15.5 12.2*  HCT 46.4 36.3*  PLT 241 219   BMET:  Recent Labs  08/24/15 0425 08/25/15 0405  NA 137 136  K 4.2 3.9  CL 98* 99*  CO2 29 30  GLUCOSE 127* 137*  BUN 15 17  CREATININE 1.07 1.18  CALCIUM 9.9 9.2    PT/INR: No results for input(s): LABPROT, INR in the last 72 hours.  CXR: FINDINGS: Cardiomediastinal silhouette is stable. Right chest tube is unchanged in position. Small partially loculated right pleural effusion. Right base atelectasis or infiltrate. There is no pneumothorax. Left lung is clear.  IMPRESSION: Right chest tube unchanged in position. Small partially loculated right pleural effusion. Right basilar atelectasis or infiltrate. No pneumothorax. Left lung is clear.   Assessment/Plan: S/P Procedure(s)  (LRB): VIDEO ASSISTED THORACOSCOPY (Right) STAPLING OF BLEBS (Right) PLEURAL ABRASION (Right) CXR stable and CT with no air leak.  Plan  d/c CT today. Pt did not ambulate much yesterday, so encouraged him to mobilize today. Continue pulm toilet/IS and wean O2. Possibly home in am.   LOS: 7 days    COLLINS,GINA H 08/25/2015  Patient seen and examined, agree with above Has a lateral loculated pleural effusion, not draining with CT- will remove CT this AM Has some clotted blood in chest tube- dc enoxaparin. He is fully ambulatory at this point Recheck CXR in AM  Fort Belknap AgencySteven C. Dorris FetchHendrickson, MD [facility] [phone]

## 2015-08-25 NOTE — Progress Notes (Signed)
08/25/2015 1300 Discontinued Fentenyl PCA.  12cc's left in syringe.  Wasted in sharps with [name] RN. [name], [name] C

## 2015-08-25 NOTE — Progress Notes (Signed)
08/25/2015 10:13 AM Orders to pull CT.  When pulled dressing off noted a quarter sized clot around outside of tube and noted clots inside tube at insertion site.  Notified Coral CeoGina Collins PAC of findings.  Orders to go ahead and pull tube. [name], Roberta Kelly C

## 2015-08-25 NOTE — Progress Notes (Signed)
Patient encouraged to ambulate in the hall but refused at this time. Pt was educated on the need for ambulation and verbalizes understanding. Will continue to monitor.......................................Marland Kitchen.Tarri GlennFolashade  Alabi, RN

## 2015-08-25 NOTE — Discharge Summary (Signed)
[address]       [place] [address]             [phone]              Discharge Summary  Name: Louis [name] WNU-UVOZ DOB: [birth year] 18 y.o. 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 There was associated shortness of breath. The pain persisted and worsened with limited activity such as walking across campus. He denies fever, chills, cough, nausea or vomiting. He ultimately presented to Urgent Care where a chest x-ray was performed, showing a large right pneumothorax. He was sent to the ER at [facility] for further evaluation and x-ray in the ER revealed a 100% right pneumothorax. TCTS was consulted and Dr. Dorris Fetch saw the patient. A right sided chest tube was placed while in the ER and the patient was admitted for chest tube management.   Hospital Course:  The patient was admitted to [facility] on 08/18/2015. Following chest tube placement, his lung re-expanded.  Chest tube was placed to water seal initially, but over the next 24 hours, he developed an increased pneumothorax and a large air leak, and he was placed on suction. Chest x-ray showed improvement in the pneumothorax, but his air leak persisted.  After 72 hours of conservative management, it was felt that he should proceed with a VATS for bleb resection to definitively manage the problem. All risks, benefits and alternatives of surgery  were explained in detail, and the patient agreed to proceed. The patient was taken to the operating room and underwent the above procedure.    The postoperative course has been uneventful.  His chest tube was placed to water seal on postop day 1 and remained stable with no air leak. There was no pneumothorax on chest x-ray.  The chest tube was able to be removed on postop day 2. Overall, the patient has remained stable postop. Incisions are healing well. Pain has been controlled with oral pain medications. He is ambulating in the halls without difficulty and is tolerating a regular diet. He has been weaned from supplemental oxygen.  Chest x-rays have remained stable with no pneumothorax. The patient is presently medically stable on today's date for discharge home.      Recent vital signs:  Filed Vitals:   08/25/15 2018 08/26/15 0617  BP: 119/61 118/70  Pulse: 119 106  Temp: 97.8 F (36.6 C) 98.7 F (37.1 C)  Resp: 18 18    Recent laboratory studies:  CBC:  Recent Labs  08/26/15 1221  WBC 9.9  HGB 11.0*  HCT 33.7*  PLT 228   BMET:   Recent Labs  08/26/15 1221  NA 138  K 3.7  CL 102  CO2 26  GLUCOSE 102*  BUN 22*  CREATININE 1.15  CALCIUM 9.1    PT/INR: No results for input(s): LABPROT, INR in the last 72 hours.    Discharge Medications:  Medication List    TAKE these medications        ibuprofen 200 MG tablet  Commonly known as:  ADVIL,MOTRIN  Take 600 mg by mouth every 6 (six) hours as needed for moderate pain.     traMADol 50 MG tablet  Commonly known as:  ULTRAM  Take 1-2 tablets (50-100 mg total) by mouth every 6 (six) hours as needed (mild pain).        Discharge Instructions:  The patient is to refrain from driving, heavy lifting or strenuous activity.  May shower daily and clean incisions with soap and water.  May resume regular diet.   Follow Up: Follow-up Information    Follow up with Loreli SlotSteven C Latavious Bitter, MD On 09/19/2015.    Specialty:  Cardiothoracic Surgery   Why:  [facility] at 9:00 , then see MD at 10:00   Contact information:   [address] [place] [id number] [phone]       Follow up with TCTS RN  On 09/04/2015.   Why:  For suture removal at 10:30     COLLINS,GINA H 08/28/2015, 7:58 AM

## 2015-08-26 ENCOUNTER — Inpatient Hospital Stay (HOSPITAL_COMMUNITY): Payer: 59

## 2015-08-26 LAB — CBC
HCT: 33.7 % — ABNORMAL LOW (ref 39.0–52.0)
Hemoglobin: 11 g/dL — ABNORMAL LOW (ref 13.0–17.0)
MCH: 27.6 pg (ref 26.0–34.0)
MCHC: 32.6 g/dL (ref 30.0–36.0)
MCV: 84.5 fL (ref 78.0–100.0)
PLATELETS: 228 10*3/uL (ref 150–400)
RBC: 3.99 MIL/uL — AB (ref 4.22–5.81)
RDW: 12.3 % (ref 11.5–15.5)
WBC: 9.9 10*3/uL (ref 4.0–10.5)

## 2015-08-26 LAB — BASIC METABOLIC PANEL
Anion gap: 10 (ref 5–15)
BUN: 22 mg/dL — AB (ref 6–20)
CALCIUM: 9.1 mg/dL (ref 8.9–10.3)
CO2: 26 mmol/L (ref 22–32)
CREATININE: 1.15 mg/dL (ref 0.61–1.24)
Chloride: 102 mmol/L (ref 101–111)
GFR calc Af Amer: >60 mL/min (ref >60)
Glucose, Bld: 102 mg/dL — ABNORMAL HIGH (ref 65–99)
Potassium: 3.7 mmol/L (ref 3.5–5.1)
SODIUM: 138 mmol/L (ref 135–145)

## 2015-08-26 MED ORDER — TRAMADOL HCL 50 MG PO TABS: 50.0000 mg | ORAL_TABLET | Freq: Four times a day (QID) | ORAL | Status: DC | PRN

## 2015-08-26 NOTE — Discharge Instructions (Signed)
Thoracoscopy, Care After °Refer to this sheet in the next few weeks. These instructions provide you with information about caring for yourself after your procedure. Your health care provider may also give you more specific instructions. Your treatment has been planned according to current medical practices, but problems sometimes occur. Call your health care provider if you have any problems or questions after your procedure. °WHAT TO EXPECT AFTER THE PROCEDURE: °After your procedure, it is common to feel sore for up to two weeks. °HOME CARE INSTRUCTIONS °· There are many different ways to close and cover an incision, including stitches (sutures), skin glue, and adhesive strips. Follow your health care provider's instructions about: °¨ Incision care. °¨ Bandage (dressing) changes and removal. °¨ Incision closure removal. °· Check your incision area every day for signs of infection. Watch for: °¨ Redness, swelling, or pain. °¨ Fluid, blood, or pus. °· Take medicines only as directed by your health care provider. °· Try to cough often. Coughing helps to protect against lung infection (pneumonia). It may hurt to cough. If this happens, hold a pillow against your chest when you cough. °· Take deep breaths. This also helps to protect against pneumonia. °· If you were given an incentive spirometer, use it as directed by your health care provider. °· Do not take baths, swim, or use a hot tub until your health care provider approves. You may take showers. °· Avoid lifting until your health care provider approves. °· Avoid driving until your health care provider approves. °· Do not travel by airplane after the chest tube is removed until your health care provider approves. °SEEK MEDICAL CARE IF: °· You have a fever. °· Pain medicines do not ease your pain. °· You have redness, swelling, or increasing pain in your incision area. °· You develop a cough that does not go away, or you are coughing up mucus that is yellow or  green. °SEEK IMMEDIATE MEDICAL CARE IF: °· You have fluid, blood, or pus coming from your incision. °· There is a bad smell coming from your incision or dressing. °· You develop a rash. °· You have difficulty breathing. °· You cough up blood. °· You develop light-headedness or you feel faint. °· You develop chest pain. °· Your heartbeat feels irregular or very fast. °  °This information is not intended to replace advice given to you by your health care provider. Make sure you discuss any questions you have with your health care provider. °  °Document Released: 04/12/2005 Document Revised: 10/14/2014 Document Reviewed: 06/08/2014 °Elsevier Interactive Patient Education ©2016 [employer] Inc. ° °

## 2015-08-26 NOTE — Progress Notes (Signed)
Discharged to home with family office visits in place teaching done Discharged to home with family office visits in place teaching done  

## 2015-08-26 NOTE — Progress Notes (Addendum)
[address]       [name] [address]             [phone]      3 Days Post-Op Procedure(s) (LRB): VIDEO ASSISTED THORACOSCOPY (Right) STAPLING OF BLEBS (Right) PLEURAL ABRASION (Right) Subjective: Feels fairly well, gets a bit dizzy going from sitting to standing, tachy to 140's with ambulation  Objective: Vital signs in last 24 hours: Temp:  [97.8 F (36.6 C)-98.7 F (37.1 C)] 98.7 F (37.1 C) (11/19 0617) Pulse Rate:  [106-119] 106 (11/19 0617) Cardiac Rhythm:  [-] Sinus tachycardia (11/18 1900) Resp:  [18] 18 (11/19 0617) BP: (118-119)/(61-70) 118/70 mmHg (11/19 0617) SpO2:  [95 %-97 %] 95 % (11/19 0617)  Hemodynamic parameters for last 24 hours:    Intake/Output from previous day: 11/18 0701 - 11/19 0700 In: 3011.3 [P.O.:240; I.V.:2771.3] Out: -  Intake/Output this shift:    General appearance: alert, cooperative and no distress Heart: regular rate and rhythm and tachy Lungs: good air exchange throughout Abdomen: benign Extremities: no edema Wound: incis healing well  Lab Results:  Recent Labs  08/24/15 0425 08/25/15 0405  WBC 14.4* 11.3*  HGB 15.5 12.2*  HCT 46.4 36.3*  PLT 241 219   BMET:  Recent Labs  08/24/15 0425 08/25/15 0405  NA 137 136  K 4.2 3.9  CL 98* 99*  CO2 29 30  GLUCOSE 127* 137*  BUN 15 17  CREATININE 1.07 1.18  CALCIUM 9.9 9.2    PT/INR: No results for input(s): LABPROT, INR in the last 72 hours. ABG    Component Value Date/Time   PHART 7.401 08/24/2015 0440   HCO3 27.6* 08/24/2015 0440   TCO2 29.0 08/24/2015 0440   O2SAT 97.4 08/24/2015 0440   CBG (last 3)  No results for input(s): GLUCAP in the last 72 hours.  Meds Scheduled Meds: . acetaminophen  1,000 mg Oral 4 times per day   Or  . acetaminophen (TYLENOL) oral liquid 160 mg/5 mL  1,000 mg Oral 4 times per day  . bisacodyl  10 mg Oral Daily  . senna-docusate  1 tablet Oral QHS   Continuous Infusions:  PRN  Meds:.diphenhydrAMINE **OR** diphenhydrAMINE, naloxone **AND** sodium chloride, ondansetron (ZOFRAN) IV, oxyCODONE, potassium chloride, traMADol  Xrays Dg Chest Port 1 View  08/25/2015  CLINICAL DATA:  Collapsed lung EXAM: PORTABLE CHEST - 1 VIEW COMPARISON:  Earlier film of the same day FINDINGS: Interval removal of right chest tube. No pneumothorax. Loculated right lateral effusion or pleural thickening as before. Subsegmental atelectasis or infiltrate at the right lung base slightly improved. Staple line at the right lung apex. Left lung remains clear. Heart size upper limits normal for technique. Regional bones unremarkable. IMPRESSION: 1. Right chest tube removal with no pneumothorax, persistent loculated lateral effusion. Electronically Signed   By: Corlis Leak M.D.   On: 08/25/2015 18:32   Dg Chest Port 1 View  08/25/2015  CLINICAL DATA:  Spontaneous pneumothorax EXAM: PORTABLE CHEST 1 VIEW COMPARISON:  08/24/2015 FINDINGS: Cardiomediastinal silhouette is stable. Right chest tube is unchanged in position. Small partially loculated right pleural effusion. Right base atelectasis or infiltrate. There is no pneumothorax. Left lung is clear. IMPRESSION: Right chest tube unchanged in position. Small partially loculated right pleural effusion. Right basilar atelectasis or infiltrate. No pneumothorax. Left lung is clear. Electronically Signed   By: Natasha Mead M.D.   On: 08/25/2015 07:53    Assessment/Plan: S/P Procedure(s) (LRB): VIDEO ASSISTED THORACOSCOPY (Right)  STAPLING OF BLEBS (Right) PLEURAL ABRASION (Right)  1 doing fairly well, check orthostatics and CBC, he is either a bit dry or anemic. 2 not sure if CXR appearance is primarily loculated effusion vs changes assoc with pleural stripping/abrasion    LOS: 8 days    [name],[name] 08/26/2015   Chart reviewed, patient examined, agree with above. CXR looks stable and I think the opacity along the right lateral chest is from the  pleurodesis with some hematoma. His Hgb is 11.0 down slightly from 12.2 yesterday but he was positive 3L yesterday so this is likely dilutional. He has not walked today but if he walks well and is comfortable he can go home.

## 2015-09-04 ENCOUNTER — Ambulatory Visit: Payer: Self-pay

## 2015-09-04 DIAGNOSIS — J9383 Other pneumothorax: Secondary | ICD-10-CM

## 2015-09-04 DIAGNOSIS — Z4802 Encounter for removal of sutures: Secondary | ICD-10-CM

## 2015-09-04 DIAGNOSIS — G8918 Other acute postprocedural pain: Secondary | ICD-10-CM

## 2015-09-04 MED ORDER — TRAMADOL HCL 50 MG PO TABS: 50.0000 mg | ORAL_TABLET | Freq: Four times a day (QID) | ORAL | Status: DC | PRN

## 2015-09-04 NOTE — Progress Notes (Signed)
Removed 1 suture from chest tube site with no signs of infection and patient tolerated well. Refilled Tramadol RX.

## 2015-09-18 ENCOUNTER — Other Ambulatory Visit: Payer: Self-pay | Admitting: Thoracic Surgery (Cardiothoracic Vascular Surgery)

## 2015-09-18 DIAGNOSIS — J9383 Other pneumothorax: Secondary | ICD-10-CM

## 2015-09-19 ENCOUNTER — Ambulatory Visit
Admission: RE | Admit: 2015-09-19 | Discharge: 2015-09-19 | Disposition: A | Discharge status: Home / Self Care | Payer: 59 | Source: Ambulatory Visit | Attending: Thoracic Surgery (Cardiothoracic Vascular Surgery) | Admitting: Thoracic Surgery (Cardiothoracic Vascular Surgery)

## 2015-09-19 ENCOUNTER — Encounter: Payer: Self-pay | Admitting: Thoracic Surgery (Cardiothoracic Vascular Surgery)

## 2015-09-19 ENCOUNTER — Ambulatory Visit (INDEPENDENT_AMBULATORY_CARE_PROVIDER_SITE_OTHER): Payer: Self-pay | Admitting: Thoracic Surgery (Cardiothoracic Vascular Surgery)

## 2015-09-19 ENCOUNTER — Other Ambulatory Visit: Payer: Self-pay | Admitting: Thoracic Surgery (Cardiothoracic Vascular Surgery)

## 2015-09-19 ENCOUNTER — Other Ambulatory Visit: Payer: Self-pay | Admitting: *Deleted

## 2015-09-19 VITALS — BP 136/91 | HR 110 | Resp 16 | Ht 72.0 in | Wt 174.0 lb

## 2015-09-19 DIAGNOSIS — J9382 Other air leak: Secondary | ICD-10-CM

## 2015-09-19 DIAGNOSIS — J9383 Other pneumothorax: Secondary | ICD-10-CM

## 2015-09-19 DIAGNOSIS — J90 Pleural effusion, not elsewhere classified: Secondary | ICD-10-CM

## 2015-09-19 DIAGNOSIS — Z9889 Other specified postprocedural states: Secondary | ICD-10-CM

## 2015-09-19 DIAGNOSIS — Z09 Encounter for follow-up examination after completed treatment for conditions other than malignant neoplasm: Secondary | ICD-10-CM

## 2015-09-19 DIAGNOSIS — IMO0002 Reserved for concepts with insufficient information to code with codable children: Secondary | ICD-10-CM

## 2015-09-19 NOTE — Progress Notes (Signed)
      [address]       [place] [id number]             [phone]       HPI:  [name] returns today for scheduled follow-up visit. 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 He had a persistent air leak and underwent a right video-assisted thoracoscopy, blebectomy and pleural stripping and abrasion on 08/20/2015.  He complains of some shortness of breath. He doesn't notice this at rest or when lying down. He only short of breath when he is walking across campus. He is not having much pain at all. He is occasionally using ibuprofen, but has not needed frequently. He did return to class and just finished final exams.   Current Outpatient Prescriptions  Medication Sig Dispense Refill  . ibuprofen (ADVIL,MOTRIN) 200 MG tablet Take 600 mg by mouth every 6 (six) hours as needed for moderate pain.    . traMADol (ULTRAM) 50 MG tablet Take 1-2 tablets (50-100 mg total) by mouth every 6 (six) hours as needed (mild pain). (Patient not taking: Reported on 09/19/2015) 40 tablet 0   No current facility-administered medications for this visit.    Physical Exam BP 136/91 mmHg  Pulse 110  Resp 16  Ht 6' (1.829 m)  Wt 174 lb (78.926 kg)  BMI 23.59 kg/m2  SpO722 7598% 19 year old male in no acute distress Alert and oriented 3 with no focal deficits Incisions well-healed Diminished breath sounds and dullness to percussion at right base  Diagnostic Tests: Chest x-ray shows a large right pleural effusion  Impression: 19 year old gentleman who had a right VATS for a spontaneous pneumothorax secondary to a ruptured apical bleb month ago. He now has a large right pleural effusion. He needs thoracentesis to drain the effusion.  Other than the effusion he is doing well. He has returned to class. He is not having much incisional pain. His activities are unrestricted.  I discussed the indications, risks, benefits, and alternatives for  thoracentesis. He has mother understand the risks include pneumothorax and bleeding.  Plan: Right thoracentesis for postoperative pleural effusion.  Loreli SlotSteven C Gissela Bloch, MD [facility] [phone]  Procedure note  After obtaining informed consent the right posterior chest wall was prepped and draped in usual sterile fashion. 5 mL of 1% lidocaine was used for local anesthesia. Right thoracentesis was performed. 2 L of bloody fluid was evacuated. Patient tolerated this well.  He will be sent for chest x-ray. I will see him back in 3 weeks with a repeat chest x-ray.  Salvatore DecentSteven C. Dorris FetchHendrickson, MD [facility] [phone]

## 2015-10-06 ENCOUNTER — Other Ambulatory Visit: Payer: Self-pay | Admitting: Thoracic Surgery (Cardiothoracic Vascular Surgery)

## 2015-10-06 DIAGNOSIS — J90 Pleural effusion, not elsewhere classified: Secondary | ICD-10-CM

## 2015-10-10 ENCOUNTER — Ambulatory Visit
Admission: RE | Admit: 2015-10-10 | Discharge: 2015-10-10 | Disposition: A | Discharge status: Home / Self Care | Payer: BLUE CROSS/BLUE SHIELD | Source: Ambulatory Visit | Attending: Thoracic Surgery (Cardiothoracic Vascular Surgery) | Admitting: Thoracic Surgery (Cardiothoracic Vascular Surgery)

## 2015-10-10 ENCOUNTER — Encounter: Payer: Self-pay | Admitting: Thoracic Surgery (Cardiothoracic Vascular Surgery)

## 2015-10-10 ENCOUNTER — Ambulatory Visit (INDEPENDENT_AMBULATORY_CARE_PROVIDER_SITE_OTHER): Payer: Self-pay | Admitting: Thoracic Surgery (Cardiothoracic Vascular Surgery)

## 2015-10-10 VITALS — BP 132/86 | HR 90 | Resp 20 | Ht 72.0 in | Wt 174.0 lb

## 2015-10-10 DIAGNOSIS — J9383 Other pneumothorax: Secondary | ICD-10-CM

## 2015-10-10 DIAGNOSIS — J90 Pleural effusion, not elsewhere classified: Secondary | ICD-10-CM

## 2015-10-10 DIAGNOSIS — J9382 Other air leak: Secondary | ICD-10-CM

## 2015-10-10 DIAGNOSIS — IMO0002 Reserved for concepts with insufficient information to code with codable children: Secondary | ICD-10-CM

## 2015-10-10 NOTE — Progress Notes (Signed)
      [address]       [place] [id number]             [phone]       HPI: [name] returns today for a scheduled follow-up visit.  He is a 20 year old gentleman who presented with a right spontaneous pneumothorax. He had a persistent air leak. He underwent a right VATS and bleb resection on 08/20/2015. He did well postoperatively initially, but on his follow-up visit in December he had a large right pleural effusion. I did a thoracentesis. He now returns for follow-up.  He says he's been feeling well. He is not having any shortness of breath. He does have a little incisional discomfort. He uses ibuprofen for that on occasion.   Past Medical History  Diagnosis Date  . Spontaneous pneumothorax 08/14/2015    Right. Right VATS 08/20/2015     Current Outpatient Prescriptions  Medication Sig Dispense Refill  . ibuprofen (ADVIL,MOTRIN) 200 MG tablet Take 600 mg by mouth every 6 (six) hours as needed for moderate pain.     No current facility-administered medications for this visit.    Physical Exam BP 132/86 mmHg  Pulse 90  Resp 20  Ht 6' (1.829 m)  Wt 174 lb (78.926 kg)  BMI 23.59 kg/m2  SpO472 6299% 20 year old male in no acute distress Well-developed well-nourished Lungs clear with equal breath sounds bilaterally Incisions healing well  Diagnostic Tests: CHEST 2 VIEW  COMPARISON: PA and lateral chest x-ray of September 19, 2015  FINDINGS: The left lung is well-expanded and clear. On the right there is a persistent small pleural effusion blunting the costophrenic angle. There is no pneumothorax or pneumomediastinum. There is no alveolar infiltrate. The heart and pulmonary vascularity are normal. The mediastinum is normal in width. The bony thorax exhibits no acute abnormality.  IMPRESSION: Persistent small right pleural effusion. Right basilar atelectasis has improved. There is no pneumothorax nor evidence of pneumonia.   Electronically  Signed  By: David SwazilandJordan M.D.  I personally reviewed the chest x-ray confirmed the findings as noted above. There is a small, clinically insignificant right effusion.  Impression: 20 year old man who is status post right VATS and blebectomy for a spontaneous pneumothorax.   He had a right pleural effusion. That has improved dramatically postthoracentesis. He does still have a very small effusion on the right side, but it is not clinically significant and no further intervention is warranted.  He has only very mild incisional pain. He is using nonsteroidals for that as needed.  His activities are unrestricted.  Plan: I will be happy to see [name] back any time in the future if I can be of any further assistance with his care  Loreli SlotSteven C Lacy Sofia, MD [facility] [phone]

## 2016-04-17 IMAGING — CR DG CHEST 2V
2 series · 2 of 2 positions shown · non-contrast
Comparison: PA and lateral chest x-ray August 26, 2015

CLINICAL DATA: History of previous pneumothorax; patient reports
shortness of breath

EXAM:
CHEST  2 VIEW

[w chest pa]
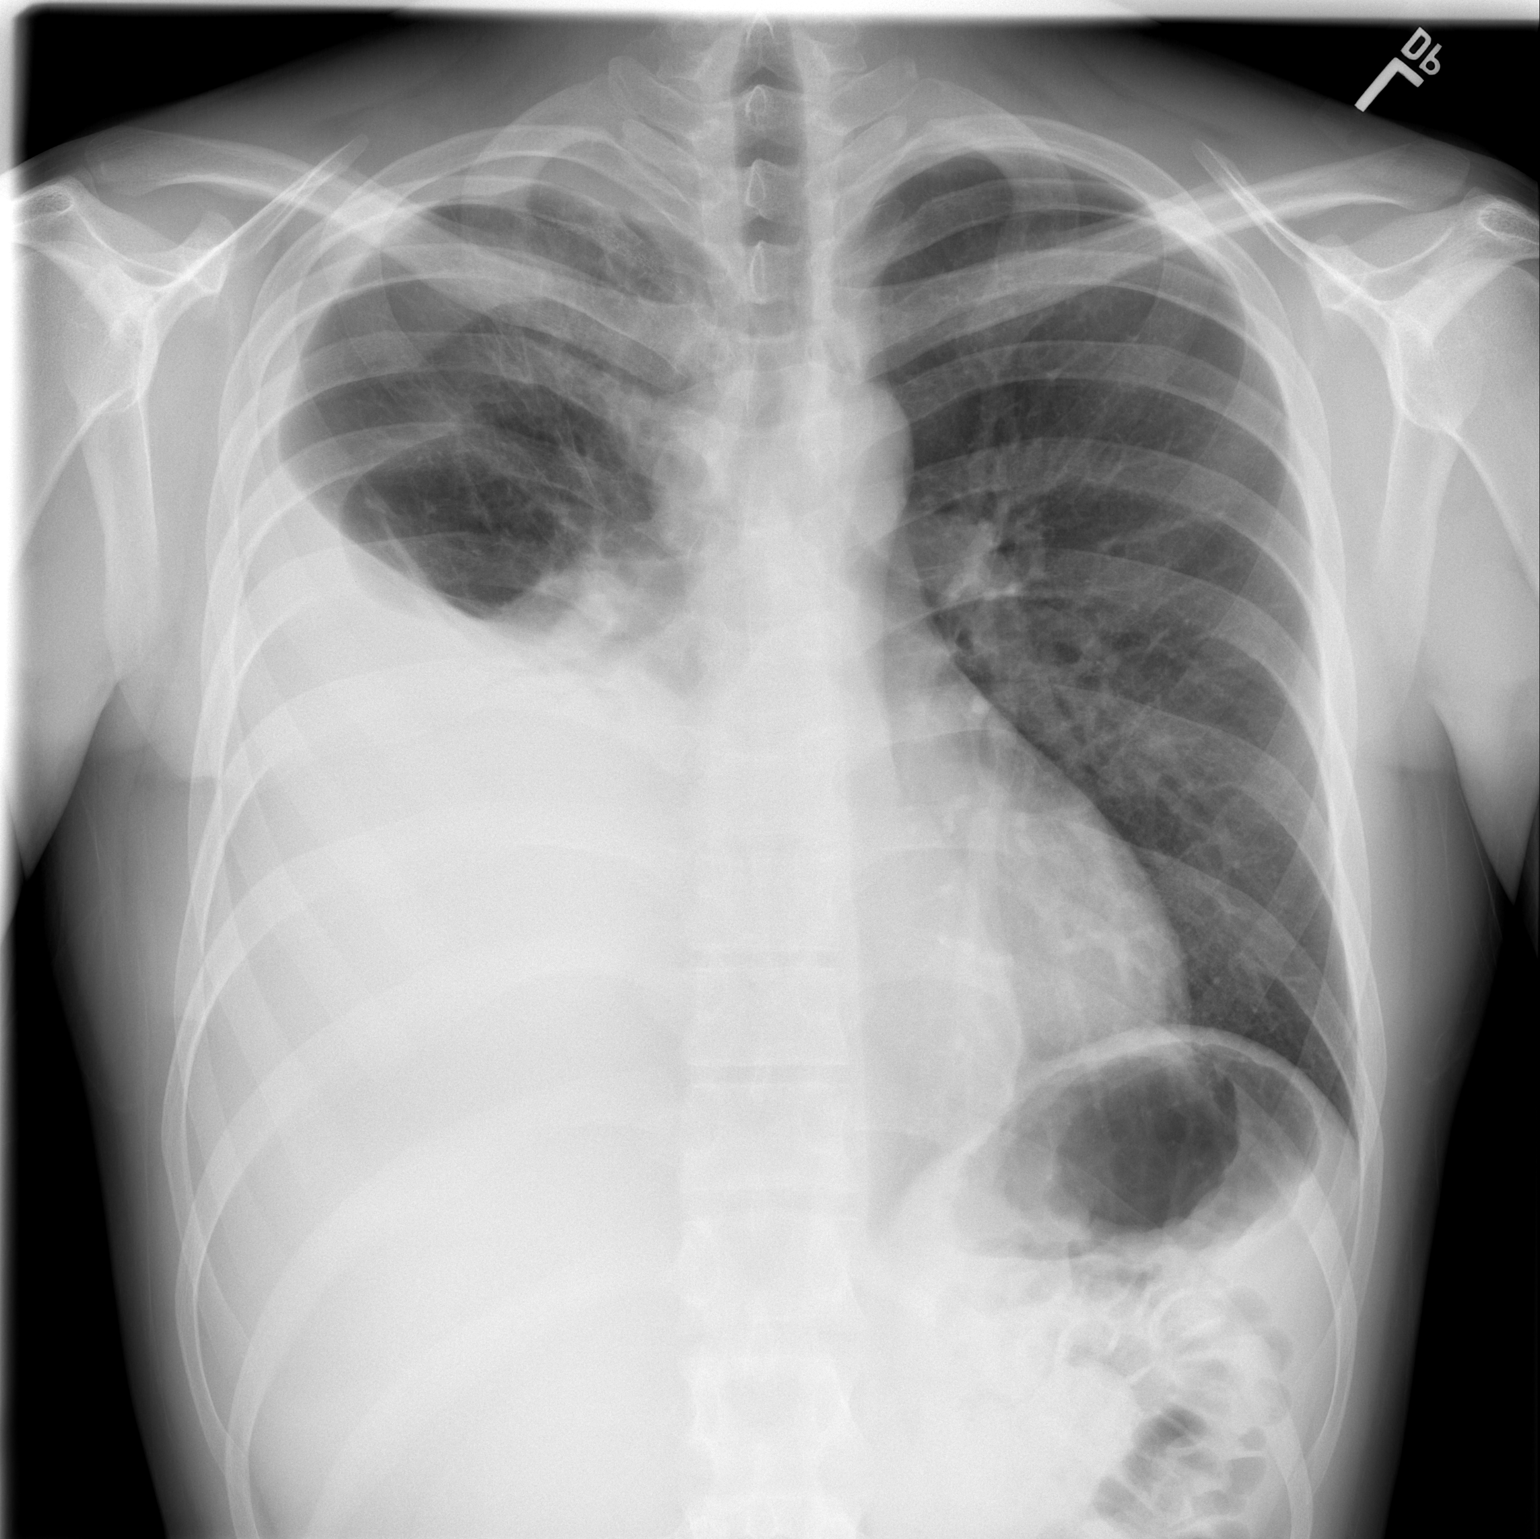

[w chest lat]
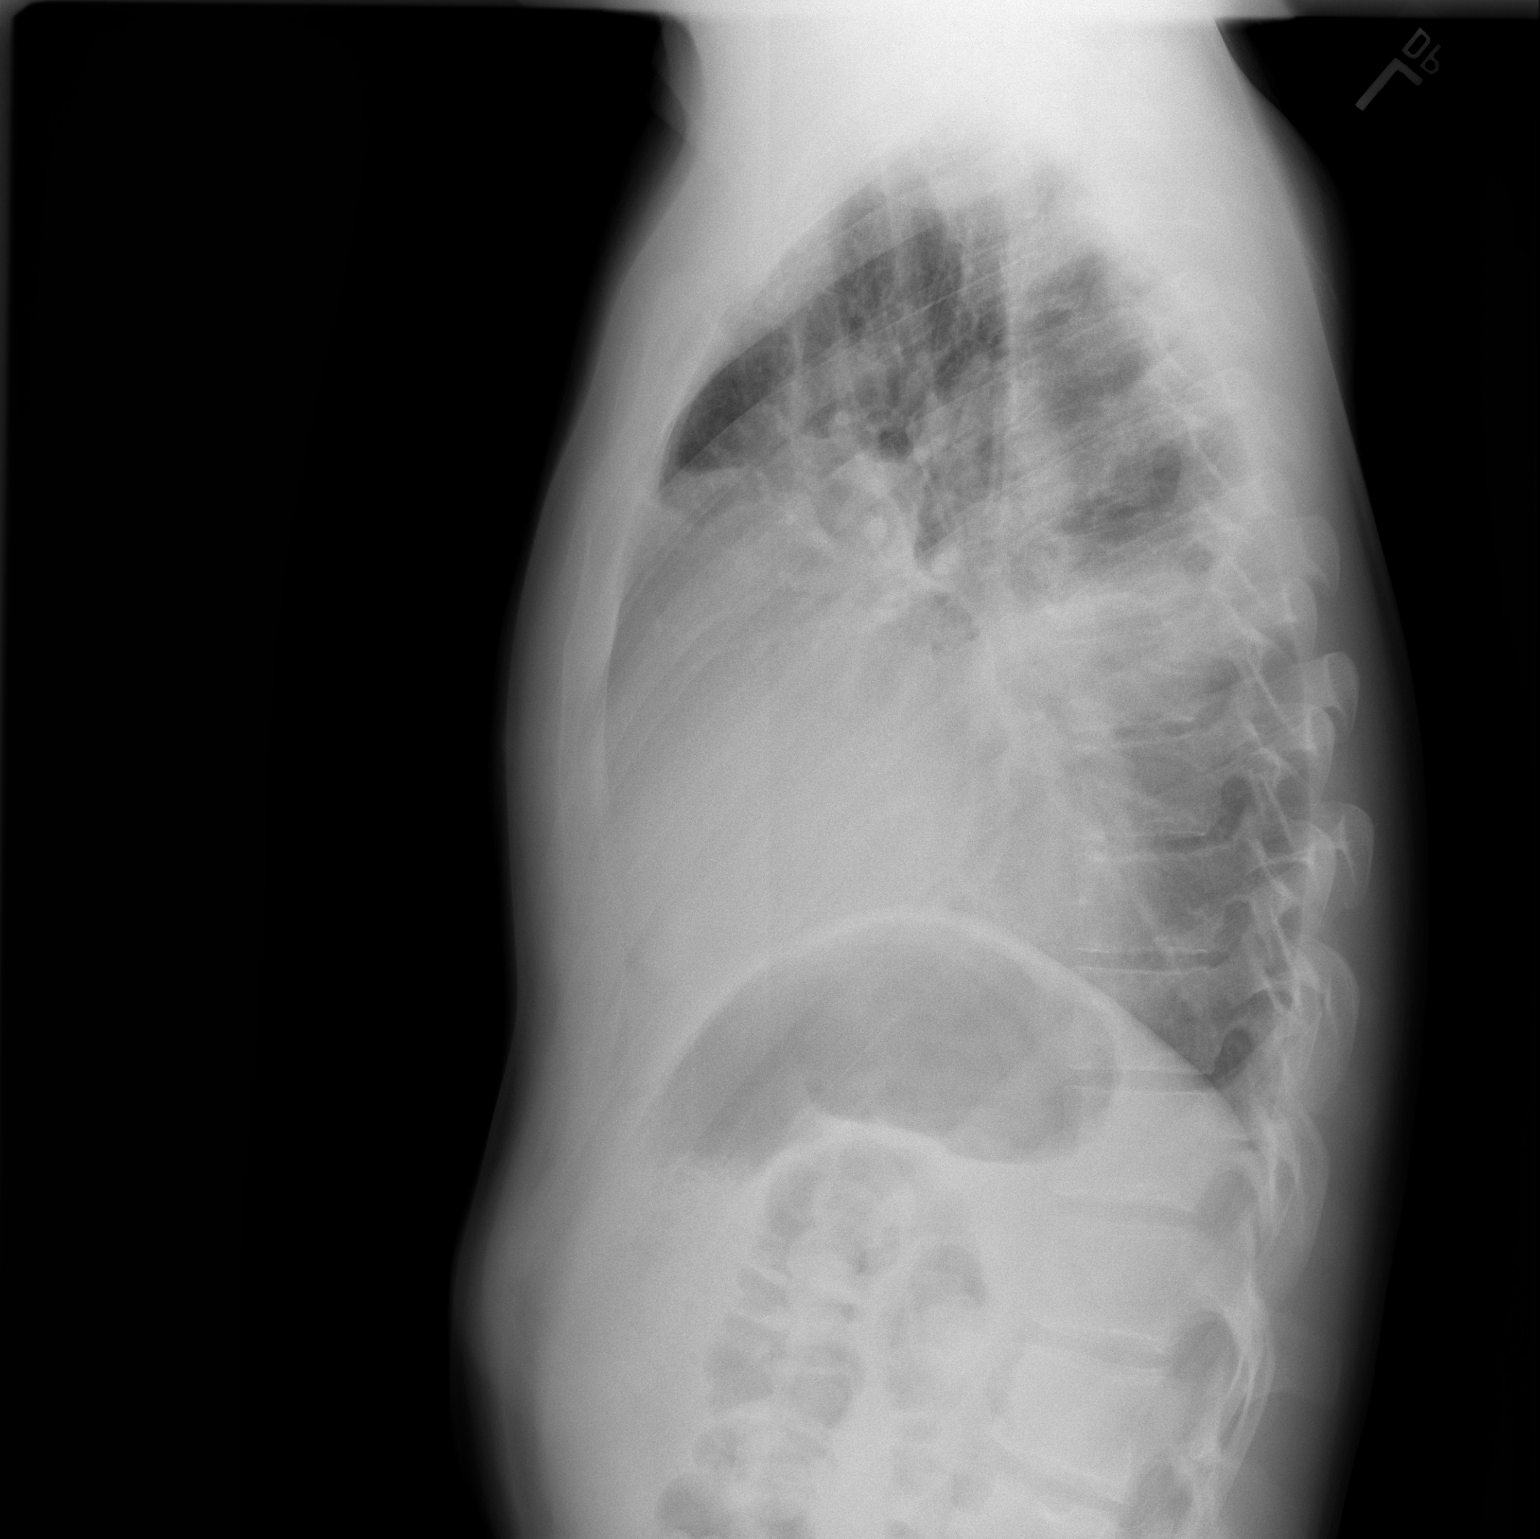

[2 of 2 positions shown; findings below may reference images not displayed]

FINDINGS: Since the previous study the volume of pleural fluid on the right
has increased significantly. Only a small amount of aerated right
upper lobe remains. The left lung is clear. The left heart border is
normal. The pulmonary vascularity is not engorged. The bony thorax
is unremarkable.
IMPRESSION: Increased volume of the right pleural effusion. No pneumothorax is
evident. The left lung is clear.

## 2020-08-17 ENCOUNTER — Encounter (HOSPITAL_BASED_OUTPATIENT_CLINIC_OR_DEPARTMENT_OTHER): Payer: Self-pay | Admitting: DERMATOLOGY

## 2020-08-17 ENCOUNTER — Ambulatory Visit: Payer: MEDICAID | Attending: DERMATOLOGY | Admitting: DERMATOLOGY

## 2020-08-17 ENCOUNTER — Other Ambulatory Visit: Payer: Self-pay

## 2020-08-17 VITALS — Temp 97.5°F | Ht 73.5 in | Wt 208.1 lb

## 2020-08-17 DIAGNOSIS — R21 Rash and other nonspecific skin eruption: Secondary | ICD-10-CM | POA: Insufficient documentation

## 2020-08-17 DIAGNOSIS — D229 Melanocytic nevi, unspecified: Secondary | ICD-10-CM | POA: Insufficient documentation

## 2020-08-17 MED ORDER — HYDROCORTISONE 2.5 % TOPICAL OINTMENT
TOPICAL_OINTMENT | Freq: Two times a day (BID) | CUTANEOUS | 0 refills | Status: DC | PRN
Start: 2020-08-17 — End: 2024-05-11

## 2020-08-17 MED ORDER — TRIAMCINOLONE ACETONIDE 0.1 % TOPICAL OINTMENT
TOPICAL_OINTMENT | Freq: Two times a day (BID) | CUTANEOUS | 0 refills | Status: DC | PRN
Start: 2020-08-17 — End: 2024-05-11

## 2020-08-17 NOTE — Progress Notes (Signed)
Dermatology Clinic, [facility]  [place] [address]  [phone]    Date:   08/17/2020  Name: Rick Grant  Age: 24 y.o.  Last visit: Visit date not found    Chief complaint: skin rash     HPI  Rick Grant is a 24 y.o. male presents for rashes. Patient has rash on bilateral hands that is intermittently itchy and red. He also has similar rash on left upper arm and left nipple. Interested in evaluation. Patient also has rash around mouth, ongoing for years that he would like checked. No other new, changing, bleeding, or symptomatic lesions or moles. No other skin-related complaints.       ROS   Constitutional: Negative for chills and fever.   Skin: positive for itching and rash.       Current Medications  No outpatient encounter medications on file as of 08/17/2020.     No facility-administered encounter medications on file as of 08/17/2020.        No Known Allergies     No past medical history on file.  Past Medical History was reviewed and is negative for skin conditions       Physical Exam  Vitals:    Temp:  [36.4 C (97.5 F)] 36.4 C (97.5 F) (11/11 1546)   Vitals:    08/17/20 1546   Temp: 36.4 C (97.5 F)   TempSrc: Thermal Scan   Weight: 94.4 kg (208 lb 1.8 oz)   Height: 1.867 m (6' 1.5")        Physical Exam  Constitutional:       General: He is not in acute distress.     Appearance: He is not ill-appearing.   Skin:              Constitutional: he appears well-developed and well-nourished.   Skin: Skin is warm and dry.       Assessment and Plan  #Rash (see #1 on body diagram)  - Favor eczematous dermatitis.   - Prescribed triamcinolone 0.1% ointment twice daily prn to   - Patient was counseled to avoid using any scented soaps, detergents, or body wash as well as dryer sheets.  Patient was encouraged to use mild soaps such as Dove.  Patient was also encouraged to moisturize regularly with unscented lotions such as Vaseline petroleum jelly.    #Rash (see #2 on  body diagram)  - Favor irritant dermatitis. Recommend application of vaseline. Prescribed hydrocortisone 2.5% ointment.     #Multiple Nevi  (including #3 on body diagram)  - Instructed patient to watch for changes in color, shape, or size and to inform us if they detect changes.  - Will follow.       - Patient was counseled on importance of protection from ultraviolet radiation from the sun and avoidance of sunburns and tanning beds. Patient was advised to use broad spectrum sunscreen daily with at least SPF 30, especially one that contains zinc and titanium. It was recommended to reapply sunscreen every two hours. The important to wear protective clothing and avoid the mid-day sun (approx 10AM till 2PM) when possible was stressed to the patient.   - Self evaluation using the ABCDE's of melanoma to locate areas of concern was discussed with the patient: Asymmetry, Border irregularity, Color variation, Diameter (>57mm), Evolution of lesion.       RTC 69-months or sooner if needed.      Corliss Skains, MD 08/17/2020 16:40  PGY-2  Dermatology  [facility]       I saw and examined the patient.  I reviewed the resident's note.  I agree with the findings and plan of care as documented in the resident's note.  Any exceptions/additions are edited/noted.    Dellia Cloud, MD

## 2021-03-15 ENCOUNTER — Ambulatory Visit (INDEPENDENT_AMBULATORY_CARE_PROVIDER_SITE_OTHER): Payer: Self-pay | Admitting: Otolaryngology

## 2021-03-29 ENCOUNTER — Ambulatory Visit (INDEPENDENT_AMBULATORY_CARE_PROVIDER_SITE_OTHER): Payer: MEDICAID | Admitting: Family

## 2021-11-21 ENCOUNTER — Encounter (INDEPENDENT_AMBULATORY_CARE_PROVIDER_SITE_OTHER): Payer: Self-pay

## 2021-11-21 ENCOUNTER — Other Ambulatory Visit: Payer: MEDICAID | Attending: Physician Assistant | Admitting: Physician Assistant

## 2021-11-21 ENCOUNTER — Ambulatory Visit (INDEPENDENT_AMBULATORY_CARE_PROVIDER_SITE_OTHER): Payer: MEDICAID

## 2021-11-21 VITALS — BP 139/99 | HR 92 | Temp 98.0°F | Resp 17 | Ht 72.0 in | Wt 188.7 lb

## 2021-11-21 DIAGNOSIS — Z113 Encounter for screening for infections with a predominantly sexual mode of transmission: Secondary | ICD-10-CM | POA: Insufficient documentation

## 2021-11-21 LAB — SYPHILIS SCREENING ALGORITHM WITH REFLEX, SERUM: SYPHILIS TP ANTIBODIES: NONREACTIVE

## 2021-11-21 LAB — HIV1/HIV2 SCREEN, COMBINED ANTIGEN AND ANTIBODY: HIV SCREEN, COMBINED ANTIGEN & ANTIBODY: NEGATIVE

## 2021-11-21 LAB — HEPATITIS C ANTIBODY SCREEN WITH REFLEX TO HCV PCR: HCV ANTIBODY QUALITATIVE: NEGATIVE

## 2021-11-21 NOTE — Progress Notes (Signed)
History of Present Illness: Boyd Buffalo is a 26 y.o. male who presents to the Urgent Care/ student [facility] today with chief complaint of    Chief Complaint            STD Screening       .   Patient need for routine STI screen.  Denies any dysuria, urethral discharge or testicular pain.  No known sexual contacts with STIs.  Patient states he has sex with women and uses condoms sometimes.  No other complaints.    I reviewed and confirmed the patient's past medical history taken by the nurse or medical assistant with the addition of the following:    Past Medical History:    History reviewed. No pertinent past medical history.      Past Surgical History:    History reviewed. No past surgical history pertinent negatives.      Allergies:  Allergies   Allergen Reactions   . Latex Hives/ Urticaria     Medications:    Current Outpatient Medications   Medication Sig   . hydrocortisone 2.5 % Ointment Apply topically Twice per day as needed To rash on face   . triamcinolone acetonide (ARISTOCORT A) 0.1 % Ointment Apply topically Twice per day as needed     Social History:       Family History: No significant family history.  Family Medical History:    None             Review of Systems:    General: no fever and no fatigue  ENT:  no sore throat, no otalgia right, no otalgia left and no runny nose  Eyes:  no pain, discharge, no photophobia and no visual change  Pulmonary:   no dry cough, no productive cough, no wheezing and no SOB  Cardiovascular:  no chest pain and no palpitations  Gastrointestinal:  no nausea, no vomiting, no diarrhea and no abdominal pain  Genitourinary:  no dysuria and no frequency  Musculoskeletal:  myalgias  Neurologic:  no paresthesias and no weakness  Psychiatric:  no depression  Skin:  no rash  All other review of systems were negative    Physical Exam:  Vital signs:   Vitals:    11/21/21 1358   BP: (!) 139/99   Pulse: 92   Resp: 17   Temp: 36.7 C (98 F)   TempSrc: Oral   SpO2: 98%   Weight: 85.6 kg  (188 lb 11.2 oz)   Height: 1.829 m (6')   BMI: 25.65             General:  Well appearing and no acute distress  Head:  Normocephalic  Eyes:  Normal lids/lashes and normal conjunctiva  ENT:  MMM  Neck:  Supple  Pulmonary:  Clear to auscultation bilaterally, no wheezes, no rales and no rhonchi  Cardiovascular:  Regular rate/rhythm, normal S1/S2 and no murmur/rub/gallop  Skin:  Warm/dry and no rash  Psychiatric:  Appropriate affect and behavior  Neurologic:   Alert and oriented x 3, no meningeal signs  Hem/Lymph:  No cervical lymphadenopathy       Data Reviewed:    No results found for this or any previous visit (from the past 12 hour(s)).      Not applicable    Course: Condition at discharge: Good     Differential Diagnosis:  STI screening    Assessment:   1. Routine screening for STI (sexually transmitted infection)  Plan:    Orders Placed This Encounter   . CHLAMYDIA TRACHOMATIS/NEISSERIA GONORRHOEAE RNA, NAAT   . HIV1/HIV2 SCREEN, COMBINED ANTIGEN AND ANTIBODY   . HEPATITIS C ANTIBODY SCREEN WITH REFLEX TO HCV PCR   . SYPHILIS SCREENING ALGORITHM WITH REFLEX, SERUM        Safe sex education was given including 100 percent condom use.  Will contact patient with any positive results.    Go to Emergency Department immediately for further work up if any concerning symptoms.  Plan was discussed and patient verbalized understanding.  If symptoms are worsening or not improving the patient should return to the Urgent Care/Student [facility] for further evaluation.      The co-signing faculty was physically present in Urgent Care/Student Health and available for consultation and did not particpate in the care of this patient.      Toney Rakes, PA-C 11/21/2021, 14:08

## 2021-11-21 NOTE — Nursing Note (Signed)
First attempt to R [facility] was collected, 1 Gold tube and 1 tall [place], RTR  11/21/2021, 14:22

## 2021-11-22 LAB — CHLAMYDIA TRACHOMATIS/NEISSERIA GONORRHOEAE RNA, NAAT
CHLAMYDIA TRACHOMATIS RNA: NEGATIVE
NEISSERIA GONORRHEA GC RNA: NEGATIVE

## 2022-04-17 ENCOUNTER — Encounter (INDEPENDENT_AMBULATORY_CARE_PROVIDER_SITE_OTHER): Payer: Self-pay

## 2022-04-17 ENCOUNTER — Ambulatory Visit (INDEPENDENT_AMBULATORY_CARE_PROVIDER_SITE_OTHER): Payer: MEDICAID

## 2022-04-17 ENCOUNTER — Other Ambulatory Visit: Payer: MEDICAID | Attending: Emergency Medicine | Admitting: Emergency Medicine

## 2022-04-17 VITALS — BP 128/88 | HR 87 | Temp 98.3°F | Resp 18 | Ht 72.0 in | Wt 192.7 lb

## 2022-04-17 DIAGNOSIS — R300 Dysuria: Secondary | ICD-10-CM

## 2022-04-17 DIAGNOSIS — N342 Other urethritis: Secondary | ICD-10-CM | POA: Insufficient documentation

## 2022-04-17 DIAGNOSIS — Z6826 Body mass index (BMI) 26.0-26.9, adult: Secondary | ICD-10-CM

## 2022-04-17 MED ORDER — DOXYCYCLINE HYCLATE 100 MG CAPSULE
100.0000 mg | ORAL_CAPSULE | Freq: Two times a day (BID) | ORAL | 0 refills | Status: AC
Start: 2022-04-17 — End: 2022-04-24

## 2022-04-17 NOTE — Progress Notes (Signed)
History of Present Illness: Rick Grant is a 26 y.o. male who presents to the [facility] today with chief complaint of    Chief Complaint            STI Screening     Pain on Urination X 2 weeks        Pt presents to the [facility] with c/o pain on urination onset 2 weeks ago. Pt denies penile discharge, penile lesions, fever or testicular pain.  Pt also denies any hx of bladder infections, kidney issues or STIs. He reports that he was last sexually active 1 week ago and mentions that he used a condom.  Pt denies any known STI exposures.    Functional Health Screening:          I reviewed and confirmed the patient's past medical history taken by the nurse or medical assistant with the addition of the following:    Past Medical History:    History reviewed. No pertinent past medical history.    Past Surgical History:    History reviewed. No pertinent surgical history.    Allergies:  Allergies   Allergen Reactions   . Latex Hives/ Urticaria     Medications:    Current Outpatient Medications   Medication Sig   . doxycycline hyclate (VIBRAMYCIN) 100 mg Oral Capsule Take 1 Capsule (100 mg total) by mouth Twice daily for 7 days   . hydrocortisone 2.5 % Ointment Apply topically Twice per day as needed To rash on face   . triamcinolone acetonide (ARISTOCORT A) 0.1 % Ointment Apply topically Twice per day as needed     Social History:      Family History:  Family Medical History:    None       Review of Systems:  General: No fever  Genitourinary: Urination pain. No penile discharge, penile lesions or testicular pain.  All other review of systems were negative    Physical Exam:  Vital signs:   Vitals:    04/17/22 1606   BP: 128/88   Pulse: 87   Resp: 18   Temp: 36.8 C (98.3 F)   TempSrc: Tympanic   SpO2: 98%   Weight: 87.4 kg (192 lb 10.9 oz)   Height: 1.829 m (6')   BMI: 26.19     Body mass index is 26.13 kg/m. Facility age limit for growth percentiles is 20 years.  No LMP for male patient.     General: Well  appearing. No acute distress  Pulmonary: No respiratory distress  Cardiovascular:  Regular rate/rhythm.  Psychiatric: Appropriate affect. Appropriate behavior. Normal speech.  Neurologic: Alert and oriented x 3.    Data Reviewed:      Point-of-care testing:                            No results found for this or any previous visit (from the past 12 hour(s)).    Course: Condition at discharge: good    Assessment:   Assessment/Plan   1. Urethritis    2. Dysuria      Plan:    Orders Placed This Encounter   . CHLAMYDIA TRACHOMATIS/NEISSERIA GONORRHOEAE RNA, NAAT   . doxycycline hyclate (VIBRAMYCIN) 100 mg Oral Capsule     Pending urine tests for gonorrhea, chlamydia will notify with results.  Patient is to refrain from sexual activity until results return and to notify all sexual partners of any positive results.  Discussed safe  sexual intercourse measures with patient.  Prescription(s) given for VIBRAMYCIN, given directions on proper use.  Declined GU exam  Advised to follow up as needed for worsening or persisting symptoms.      Go to Emergency Department immediately for further work up if any concerning symptoms.  Plan was discussed and patient verbalized understanding.  If symptoms are worsening or not improving the patient should return to [facility] for further evaluation.    I am scribing for, and in the presence of, Dr. Theotis Burrow for services provided on 04/17/2022.  Tafadzwa Chapoto, SCRIBE     Tafadzwa Chapoto, SCRIBE 04/17/2022, 16:31     I personally performed the services described in this documentation, as scribed  in my presence, and it is both accurate  and complete.    Guy Franco, DO

## 2022-04-18 LAB — CHLAMYDIA TRACHOMATIS/NEISSERIA GONORRHOEAE RNA, NAAT
CHLAMYDIA TRACHOMATIS RNA: POSITIVE — AB
NEISSERIA GONORRHEA GC RNA: NEGATIVE

## 2022-05-21 ENCOUNTER — Other Ambulatory Visit (HOSPITAL_COMMUNITY): Payer: Self-pay

## 2022-05-21 ENCOUNTER — Ambulatory Visit (HOSPITAL_COMMUNITY): Payer: Self-pay

## 2022-05-21 DIAGNOSIS — R002 Palpitations: Secondary | ICD-10-CM

## 2022-05-21 DIAGNOSIS — M542 Cervicalgia: Secondary | ICD-10-CM

## 2022-05-21 DIAGNOSIS — R079 Chest pain, unspecified: Secondary | ICD-10-CM

## 2022-05-21 NOTE — Telephone Encounter (Signed)
Attempted to contact patient. No answer. Left message.   Self-referral information sent to [facility] Referral team to schedule.

## 2022-05-21 NOTE — Telephone Encounter (Signed)
-----   Message from Turner Daniels, RN sent at 05/21/2022  7:57 AM EDT -----  Regarding: FW: Self referral  ----- Message from Lang[place]is sent at 05/20/2022  4:53 PM EDT -----  Pt is calling to schedule with cardiology.    Pt is having heart palpatations and dull pain in chest and side of neck.    Pt can be reached at [phone].    Thank you

## 2022-07-08 ENCOUNTER — Telehealth (HOSPITAL_COMMUNITY): Payer: Self-pay | Admitting: Student in an Organized Health Care Education/Training Program

## 2022-07-08 NOTE — Telephone Encounter (Signed)
[facility]  HVI Care Coordinator- Follow Up Note    Attempted to contact patient to see if records need to be obtained prior to upcoming apt. No answer. LVM requesting callback.     Tennis Ship, RN  07/08/2022, 15:28  [place] [phone]  Fax [phone]

## 2022-07-10 ENCOUNTER — Other Ambulatory Visit: Payer: Self-pay

## 2022-07-10 ENCOUNTER — Ambulatory Visit (INDEPENDENT_AMBULATORY_CARE_PROVIDER_SITE_OTHER): Payer: MEDICAID

## 2022-07-10 DIAGNOSIS — Z111 Encounter for screening for respiratory tuberculosis: Secondary | ICD-10-CM

## 2022-07-10 NOTE — Patient Instructions (Addendum)
Tuberculin purified protein derivative, PPD injection   What is this medicine?   TUBERCULIN PURIFIED PROTEIN DERIVATIVE, PPD is a test used to detect if you have a tuberculosis infection. It will not cause tuberculosis infection.   This medicine may be used for other purposes; ask your health care provider or pharmacist if you have questions.   COMMON BRAND NAME(S): Aplisol, Tubersol     What should I tell my health care provider before I take this medicine?   They need to know if you have any of these conditions:   -diabetes   -HIV or AIDS   -immune system problems   -infection (especially a virus infection such as chickenpox, cold sores, or herpes)   -kidney disease   -malnutrition   -an unusual or allergic reaction to tuberculin purified protein derivative, PPD, other medicines, foods, dyes, or preservatives   -pregnant or trying to get pregnant   -breast-feeding     How should I use this medicine?   This medicine is for injection in the skin. It is given by a health care professional in a hospital or clinic setting.   Talk to your pediatrician regarding the use of this medicine in children. While this drug may be prescribed in children, precautions do apply.   Overdosage: If you think you've taken too much of this medicine contact a poison control center or emergency room at once.   Overdosage: If you think you have taken too much of this medicine contact a poison control center or emergency room at once.   NOTE: This medicine is only for you. Do not share this medicine with others.     What if I miss a dose?   It is important not to miss your dose. Call your doctor or health care professional if you are unable to keep an appointment.     What may interact with this medicine?   -adalimumab   -anakinra   -etanercept   -infliximab   -live virus vaccines   -medicines to treat cancer   -steroid medicines like prednisone or cortisone   This list may not describe all possible interactions. Give your health care  provider a list of all the medicines, herbs, non-prescription drugs, or dietary supplements you use. Also tell them if you smoke, drink alcohol, or use illegal drugs. Some items may interact with your medicine.     What should I watch for while using this medicine?   See your health care provider as directed.   This medicine does not protect against tuberculosis.     What side effects may I notice from receiving this medicine?   Side effects that you should report to your doctor or health care professional as soon as possible:   -allergic reactions like skin rash, itching or hives, swelling of the face, lips, or tongue   -breathing problems   Side effects that usually do not require medical attention (Report these to your doctor or health care professional if they continue or are bothersome.):   -bruising   -pain, redness, or irritation at site where injected   This list may not describe all possible side effects. Call your doctor for medical advice about side effects. You may report side effects to FDA at 1-800-FDA-1088.     Where should I keep my medicine?   This drug is given in a hospital or clinic and will not be stored at home.   NOTE: This sheet is a summary. It may not cover all   possible information. If you have questions about this medicine, talk to your doctor, pharmacist, or health care provider.    2013, Elsevier/Gold Standard. (11/16/2009 1:24:18 PM)     Tuberculin purified protein derivative, PPD injection   What is this medicine?   TUBERCULIN PURIFIED PROTEIN DERIVATIVE, PPD is a test used to detect if you have a tuberculosis infection. It will not cause tuberculosis infection.   This medicine may be used for other purposes; ask your health care provider or pharmacist if you have questions.   COMMON BRAND NAME(S): Aplisol, Tubersol     What should I tell my health care provider before I take this medicine?   They need to know if you have any of these conditions:   -diabetes   -HIV or AIDS   -immune  system problems   -infection (especially a virus infection such as chickenpox, cold sores, or herpes)   -kidney disease   -malnutrition   -an unusual or allergic reaction to tuberculin purified protein derivative, PPD, other medicines, foods, dyes, or preservatives   -pregnant or trying to get pregnant   -breast-feeding     How should I use this medicine?   This medicine is for injection in the skin. It is given by a health care professional in a hospital or clinic setting.   Talk to your pediatrician regarding the use of this medicine in children. While this drug may be prescribed in children, precautions do apply.   Overdosage: If you think you've taken too much of this medicine contact a poison control center or emergency room at once.   Overdosage: If you think you have taken too much of this medicine contact a poison control center or emergency room at once.   NOTE: This medicine is only for you. Do not share this medicine with others.     What if I miss a dose?   It is important not to miss your dose. Call your doctor or health care professional if you are unable to keep an appointment.     What may interact with this medicine?   -adalimumab   -anakinra   -etanercept   -infliximab   -live virus vaccines   -medicines to treat cancer   -steroid medicines like prednisone or cortisone   This list may not describe all possible interactions. Give your health care provider a list of all the medicines, herbs, non-prescription drugs, or dietary supplements you use. Also tell them if you smoke, drink alcohol, or use illegal drugs. Some items may interact with your medicine.     What should I watch for while using this medicine?   See your health care provider as directed.   This medicine does not protect against tuberculosis.     What side effects may I notice from receiving this medicine?   Side effects that you should report to your doctor or health care professional as soon as possible:   -allergic reactions like  skin rash, itching or hives, swelling of the face, lips, or tongue   -breathing problems   Side effects that usually do not require medical attention (Report these to your doctor or health care professional if they continue or are bothersome.):   -bruising   -pain, redness, or irritation at site where injected   This list may not describe all possible side effects. Call your doctor for medical advice about side effects. You may report side effects to FDA at 1-800-FDA-1088.     Where should I keep my   medicine?   This drug is given in a hospital or clinic and will not be stored at home.   NOTE: This sheet is a summary. It may not cover all possible information. If you have questions about this medicine, talk to your doctor, pharmacist, or health care provider.    2013, Elsevier/Gold Standard. (11/16/2009 1:24:18 PM)     Tuberculin purified protein derivative, PPD injection   What is this medicine?   TUBERCULIN PURIFIED PROTEIN DERIVATIVE, PPD is a test used to detect if you have a tuberculosis infection. It will not cause tuberculosis infection.   This medicine may be used for other purposes; ask your health care provider or pharmacist if you have questions.   COMMON BRAND NAME(S): Aplisol, Tubersol     What should I tell my health care provider before I take this medicine?   They need to know if you have any of these conditions:   -diabetes   -HIV or AIDS   -immune system problems   -infection (especially a virus infection such as chickenpox, cold sores, or herpes)   -kidney disease   -malnutrition   -an unusual or allergic reaction to tuberculin purified protein derivative, PPD, other medicines, foods, dyes, or preservatives   -pregnant or trying to get pregnant   -breast-feeding     How should I use this medicine?   This medicine is for injection in the skin. It is given by a health care professional in a hospital or clinic setting.   Talk to your pediatrician regarding the use of this medicine in children. While  this drug may be prescribed in children, precautions do apply.   Overdosage: If you think you've taken too much of this medicine contact a poison control center or emergency room at once.   Overdosage: If you think you have taken too much of this medicine contact a poison control center or emergency room at once.   NOTE: This medicine is only for you. Do not share this medicine with others.     What if I miss a dose?   It is important not to miss your dose. Call your doctor or health care professional if you are unable to keep an appointment.     What may interact with this medicine?   -adalimumab   -anakinra   -etanercept   -infliximab   -live virus vaccines   -medicines to treat cancer   -steroid medicines like prednisone or cortisone   This list may not describe all possible interactions. Give your health care provider a list of all the medicines, herbs, non-prescription drugs, or dietary supplements you use. Also tell them if you smoke, drink alcohol, or use illegal drugs. Some items may interact with your medicine.     What should I watch for while using this medicine?   See your health care provider as directed.   This medicine does not protect against tuberculosis.     What side effects may I notice from receiving this medicine?   Side effects that you should report to your doctor or health care professional as soon as possible:   -allergic reactions like skin rash, itching or hives, swelling of the face, lips, or tongue   -breathing problems   Side effects that usually do not require medical attention (Report these to your doctor or health care professional if they continue or are bothersome.):   -bruising   -pain, redness, or irritation at site where injected   This list may not describe  all possible side effects. Call your doctor for medical advice about side effects. You may report side effects to FDA at 1-800-FDA-1088.     Where should I keep my medicine?   This drug is given in a hospital or clinic and  will not be stored at home.   NOTE: This sheet is a summary. It may not cover all possible information. If you have questions about this medicine, talk to your doctor, pharmacist, or health care provider.    2013, Elsevier/Gold Standard. (11/16/2009 1:24:18 PM)     Tuberculin purified protein derivative, PPD injection   What is this medicine?   TUBERCULIN PURIFIED PROTEIN DERIVATIVE, PPD is a test used to detect if you have a tuberculosis infection. It will not cause tuberculosis infection.   This medicine may be used for other purposes; ask your health care provider or pharmacist if you have questions.   COMMON BRAND NAME(S): Aplisol, Tubersol     What should I tell my health care provider before I take this medicine?   They need to know if you have any of these conditions:   -diabetes   -HIV or AIDS   -immune system problems   -infection (especially a virus infection such as chickenpox, cold sores, or herpes)   -kidney disease   -malnutrition   -an unusual or allergic reaction to tuberculin purified protein derivative, PPD, other medicines, foods, dyes, or preservatives   -pregnant or trying to get pregnant   -breast-feeding     How should I use this medicine?   This medicine is for injection in the skin. It is given by a health care professional in a hospital or clinic setting.   Talk to your pediatrician regarding the use of this medicine in children. While this drug may be prescribed in children, precautions do apply.   Overdosage: If you think you've taken too much of this medicine contact a poison control center or emergency room at once.   Overdosage: If you think you have taken too much of this medicine contact a poison control center or emergency room at once.   NOTE: This medicine is only for you. Do not share this medicine with others.     What if I miss a dose?   It is important not to miss your dose. Call your doctor or health care professional if you are unable to keep an appointment.     What may  interact with this medicine?   -adalimumab   -anakinra   -etanercept   -infliximab   -live virus vaccines   -medicines to treat cancer   -steroid medicines like prednisone or cortisone   This list may not describe all possible interactions. Give your health care provider a list of all the medicines, herbs, non-prescription drugs, or dietary supplements you use. Also tell them if you smoke, drink alcohol, or use illegal drugs. Some items may interact with your medicine.     What should I watch for while using this medicine?   See your health care provider as directed.   This medicine does not protect against tuberculosis.     What side effects may I notice from receiving this medicine?   Side effects that you should report to your doctor or health care professional as soon as possible:   -allergic reactions like skin rash, itching or hives, swelling of the face, lips, or tongue   -breathing problems   Side effects that usually do not require medical attention (Report these to  your doctor or health care professional if they continue or are bothersome.):   -bruising   -pain, redness, or irritation at site where injected   This list may not describe all possible side effects. Call your doctor for medical advice about side effects. You may report side effects to FDA at 1-800-FDA-1088.     Where should I keep my medicine?   This drug is given in a hospital or clinic and will not be stored at home.   NOTE: This sheet is a summary. It may not cover all possible information. If you have questions about this medicine, talk to your doctor, pharmacist, or health care provider.    2013, Elsevier/Gold Standard. (11/16/2009 1:24:18 PM)

## 2022-07-15 ENCOUNTER — Ambulatory Visit (INDEPENDENT_AMBULATORY_CARE_PROVIDER_SITE_OTHER): Payer: MEDICAID

## 2022-07-15 ENCOUNTER — Other Ambulatory Visit: Payer: Self-pay

## 2022-07-15 DIAGNOSIS — Z111 Encounter for screening for respiratory tuberculosis: Secondary | ICD-10-CM

## 2022-07-15 NOTE — Nursing Note (Signed)
07/15/22 1500   PATIENT QUESTIONS   1. Has the patient ever had a tuberculosis screening? yes   2. Has the patient ever been exposed to tuberculosis?  no   3. Has the patient ever received a vaccination for tuberculosis?  no   4. Are you currently taking immunosupressants? n   5. Have you received a live vaccine in the last 6 weeks? n   6. Has the patient ever had a positive PPD? No   7. Is the patient having an unintentional weight loss? No   8. Does the patient have a fever? No   9. Does the patient have night sweats? No   10. Does the patient have a cough? No   12. Does the patient need a 2 step PPD?  Live vaccines should be held until 2nd step No   [facility]       Immunization administered       Name Date Dose VIS Date Route    PPD 07/15/2022 0.1 mL N/A Intradermal    Site: Right arm    Given By: Almyra Free, RN    Manufacturer: [employer]    Lot: [id number]    Damar: [phone]

## 2022-07-15 NOTE — Patient Instructions (Signed)
Tuberculin purified protein derivative, PPD injection   What is this medicine?   TUBERCULIN PURIFIED PROTEIN DERIVATIVE, PPD is a test used to detect if you have a tuberculosis infection. It will not cause tuberculosis infection.   This medicine may be used for other purposes; ask your health care provider or pharmacist if you have questions.   COMMON BRAND NAME(S): Aplisol, Tubersol     What should I tell my health care provider before I take this medicine?   They need to know if you have any of these conditions:   -diabetes   -HIV or AIDS   -immune system problems   -infection (especially a virus infection such as chickenpox, cold sores, or herpes)   -kidney disease   -malnutrition   -an unusual or allergic reaction to tuberculin purified protein derivative, PPD, other medicines, foods, dyes, or preservatives   -pregnant or trying to get pregnant   -breast-feeding     How should I use this medicine?   This medicine is for injection in the skin. It is given by a health care professional in a hospital or clinic setting.   Talk to your pediatrician regarding the use of this medicine in children. While this drug may be prescribed in children, precautions do apply.   Overdosage: If you think you've taken too much of this medicine contact a poison control center or emergency room at once.   Overdosage: If you think you have taken too much of this medicine contact a poison control center or emergency room at once.   NOTE: This medicine is only for you. Do not share this medicine with others.     What if I miss a dose?   It is important not to miss your dose. Call your doctor or health care professional if you are unable to keep an appointment.     What may interact with this medicine?   -adalimumab   -anakinra   -etanercept   -infliximab   -live virus vaccines   -medicines to treat cancer   -steroid medicines like prednisone or cortisone   This list may not describe all possible interactions. Give your health care  provider a list of all the medicines, herbs, non-prescription drugs, or dietary supplements you use. Also tell them if you smoke, drink alcohol, or use illegal drugs. Some items may interact with your medicine.     What should I watch for while using this medicine?   See your health care provider as directed.   This medicine does not protect against tuberculosis.     What side effects may I notice from receiving this medicine?   Side effects that you should report to your doctor or health care professional as soon as possible:   -allergic reactions like skin rash, itching or hives, swelling of the face, lips, or tongue   -breathing problems   Side effects that usually do not require medical attention (Report these to your doctor or health care professional if they continue or are bothersome.):   -bruising   -pain, redness, or irritation at site where injected   This list may not describe all possible side effects. Call your doctor for medical advice about side effects. You may report side effects to FDA at 1-800-FDA-1088.     Where should I keep my medicine?   This drug is given in a hospital or clinic and will not be stored at home.   NOTE: This sheet is a summary. It may not cover all   possible information. If you have questions about this medicine, talk to your doctor, pharmacist, or health care provider.    2013, Elsevier/Gold Standard. (11/16/2009 1:24:18 PM)

## 2022-07-17 LAB — POCT PPD READING

## 2022-07-17 NOTE — H&P (Signed)
Cardiology   New Patient Clinic Note    Name: Rick Grant   DOB: [birth year]  [26 y.o. male]   MRN: [id number]       Visit Date: 07/18/2022   Referring: Bertram Millard Outside Medical Doctor  No address on file   PCP: No Pcp         Chief Complaint: New Patient      History of Present Illness   Rick Grant is a 26 y.o. White male with past medical history of pneumothorax (2017) presents with palpitations.     Palpitations x3 months, drinks 2 cans of soda with caffeine, described as pounding hard. Drinks a cup of coffee in the morning. 3-4 cups of water a day. Lasting 15 mins in the morning only after he took stairs. Last time it occurred was 2 wks. No rec drug, alcohol, cig use.  Denies any syncope.  Patient does weight lifting. Climbs up 30 steps in the morning.  Patient noted improvement when he decrease caffeine intake.  Patient never had Holter before. Has apple watch, stated it would be able to record rhythm.    Patient also mentioned he had history of pneumothorax in 2017, received surgery, no recurrence.  Followed at [facility].  Patient noted when he drove back to [facility], patient felt right-sided chest burning sensation.  Patient also had residual small pleural effusion on the right side.  I instructed patient to follow up with primary care physician.    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    There are no problems to display for this patient.      Allergies  Allergies   Allergen Reactions    Latex Hives/ Urticaria       Medications    Current Outpatient Medications:     hydrocortisone 2.5 % Ointment, Apply topically Twice per day as needed To rash on face (Patient not taking: Reported on 07/18/2022), Disp: 28 g, Rfl: 0    triamcinolone acetonide (ARISTOCORT A) 0.1 % Ointment, Apply topically Twice per day as needed (Patient not taking: Reported on 07/18/2022), Disp: 453 g, Rfl: 0    History  Social History     Socioeconomic History    Marital status: Single   Tobacco Use    Smokeless tobacco: Never    Substance and Sexual Activity    Alcohol use: Yes     Alcohol/week: 2.0 standard drinks of alcohol     Types: 2 Shots of liquor per week    Drug use: Never     No past surgical history on file.  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    Review of Systems:  ROS    All other systems reviewed and either negative or not pertinent.     Physical Examination:  BP 126/88   Pulse 72   Temp 36.5 C (97.7 F)   Wt 87.5 kg (192 lb 14.4 oz)   SpO2 97%   BMI 26.16 kg/m       Temperature: 36.5 C (97.7 F)  Heart Rate: 72  BP (Non-Invasive): 126/88  SpO2: 97 %  Constitutional: This is a well developed well nourished male  Eyes: EOMI, no scleral jaundice or conjunctival erythema noted.   HENT: Normocephalic and  atraumatic.  Neck:  Supple, symmetric, trachea midline, no masses. The thyroid appears normal. There is no JVD.  Respiratory: Lungs are clear to auscultation. No wheezing, rales or rhonchi noted. There is normal respiratory effort.  Cardiovascular: S1, S2 are normal. The rhythm is regular. There is no click, murmur, rub or gallop noted.   Lower Extremities:  No cyanosis or edema noted.  Gastrointestinal: Normal bowel sounds  The abdomen is soft, non-distended, non-tender with out guarding or rebound. There are no masses appreciated.  Musculoskeletal: No obvious deformity or swelling.   Skin: normal coloration and turgor, no rashes noted.  Neurologic: CN's Grossly intact, alert and oriented X 3.  Genitourinary: Deferred  Lymphatic: No lymphadenopathy appreciated.  Psychiatric: appears in good mood, fluent in speech and cognition.       Laboratory:  I have reviewed the patient's lab values and culture results. Most recent results in Epic are below:    CBC    No results found for: "WBC", "WBCJ", "HGB", "HCT", "PLTCNT", "SEDRATE",  "ESR", "RBC", "MCV", "MCHC", "MCH", "RDW", "MPV"     Comprehensive Metabolic Profile   No results found for: "SODIUM", "POTASSIUM", "CHLORIDE", "CO2", "ANIONGAP", "BUN", "CREATININE", "GLUCOSE"   No results found for: "ALBUMIN", "TOTALPROTEIN", "AST", "ALT"   No results found for: "BNP", "TSH", "HA1C", "INR"  No results found for: "TRIG", "HDLCHOL", "LDLCHOL", "CHOLESTEROL"    Diagnostics  ECG 07/18/2022 Normal sinus rhythm with benign early repolarization   Echocardiogram  N/A    Stress test   N/A   Catherization  N/A   CT/ CTA  N/A   MRI  N/A   ASCVD score   N/A     Orders Placed This Encounter    ECG 12-LEAD - ADD ON IN [facility] CLINICS ONLY       There are no discontinued medications.    Assessment and Plan:  Rick Grant is a 26 y.o. White male with past medical history of pneumothorax (2017) presents with chest pounding, infrequent symptoms, improving with less caffeine intake, last episode was 2 weeks ago, less likely to be arrhythmia, instructed patient to monitor as needed with his apple watch due to infrequent symptoms.        Assessment/Plan   1. Palpitations    2. Chest pain, unspecified type    3. Neck pain    4. Establishing care with new doctor, encounter for        Orders:     Orders Placed This Encounter    ECG 12-LEAD - ADD ON IN [facility] CLINICS ONLY       Follow up:  Return if symptoms worsen or fail to improve.    Discussed with Dr. Larey Seat.     Bubba Camp, MD  Fellow, Cardiovascular Disease  [place] [facility], Section of Cardiology    A portion of this documentation may have been generated using [employer] voice recognition software and may contain syntax/voice recognition errors.     I saw and examined the patient.  I reviewed the resident's note.  I agree with the findings and plan of care as documented in the resident's note.  Any exceptions/additions are edited/noted.    Dorothyann Peng, MD

## 2022-07-18 ENCOUNTER — Other Ambulatory Visit: Payer: Self-pay

## 2022-07-18 ENCOUNTER — Ambulatory Visit
Payer: MEDICAID | Attending: Internal Medicine | Admitting: Student in an Organized Health Care Education/Training Program

## 2022-07-18 ENCOUNTER — Encounter (HOSPITAL_COMMUNITY): Payer: Self-pay | Admitting: Student in an Organized Health Care Education/Training Program

## 2022-07-18 VITALS — BP 126/88 | HR 72 | Temp 97.7°F | Wt 192.9 lb

## 2022-07-18 DIAGNOSIS — M542 Cervicalgia: Secondary | ICD-10-CM | POA: Insufficient documentation

## 2022-07-18 DIAGNOSIS — R079 Chest pain, unspecified: Secondary | ICD-10-CM | POA: Insufficient documentation

## 2022-07-18 DIAGNOSIS — R002 Palpitations: Secondary | ICD-10-CM | POA: Insufficient documentation

## 2022-07-18 DIAGNOSIS — Z7689 Persons encountering health services in other specified circumstances: Secondary | ICD-10-CM | POA: Insufficient documentation

## 2022-07-19 DIAGNOSIS — Z136 Encounter for screening for cardiovascular disorders: Secondary | ICD-10-CM

## 2022-07-19 DIAGNOSIS — Z7689 Persons encountering health services in other specified circumstances: Secondary | ICD-10-CM

## 2022-07-19 LAB — ECG 12-LEAD
Atrial Rate: 70 {beats}/min
Calculated P Axis: 43 degrees
Calculated R Axis: 64 degrees
Calculated T Axis: 45 degrees
PR Interval: 144 ms
QRS Duration: 92 ms
QT Interval: 360 ms
QTC Calculation: 388 ms
Ventricular rate: 70 {beats}/min

## 2022-08-20 ENCOUNTER — Other Ambulatory Visit: Payer: MEDICAID | Attending: Physician Assistant | Admitting: Physician Assistant

## 2022-08-20 ENCOUNTER — Other Ambulatory Visit: Payer: Self-pay

## 2022-08-20 ENCOUNTER — Ambulatory Visit (INDEPENDENT_AMBULATORY_CARE_PROVIDER_SITE_OTHER): Payer: MEDICAID

## 2022-08-20 ENCOUNTER — Encounter (INDEPENDENT_AMBULATORY_CARE_PROVIDER_SITE_OTHER): Payer: Self-pay

## 2022-08-20 VITALS — BP 149/92 | HR 70 | Temp 97.7°F | Resp 18 | Ht 72.0 in | Wt 195.1 lb

## 2022-08-20 DIAGNOSIS — Z113 Encounter for screening for infections with a predominantly sexual mode of transmission: Secondary | ICD-10-CM

## 2022-08-20 LAB — HEPATITIS C ANTIBODY SCREEN WITH REFLEX TO HCV PCR: HCV ANTIBODY QUALITATIVE: NEGATIVE

## 2022-08-20 LAB — SYPHILIS SCREENING ALGORITHM WITH REFLEX, SERUM: SYPHILIS TP ANTIBODIES: NONREACTIVE

## 2022-08-20 LAB — HIV1/HIV2 SCREEN, COMBINED ANTIGEN AND ANTIBODY: HIV SCREEN, COMBINED ANTIGEN & ANTIBODY: NEGATIVE

## 2022-08-20 NOTE — Nursing Note (Signed)
Labs drawn from right AC x1 attempt. Covered with gauze and band-aid. Pt tolerated well.    Wilba Mutz, LPN

## 2022-08-20 NOTE — Progress Notes (Signed)
Attending physician: Dr. Theotis Burrow  History of Present Illness: Rick Grant is a 26 y.o. male who presents to the [facility] today with chief complaint of    Chief Complaint              STI Screening Wants urine and blood testing          Pt presents to [facility] with request to acquire STI screening. Pt reports that he has no symptoms at this time and has no known infected sexual contacts. He says that he wanted to be sure that he did not have an STI. Pt says that he has only male sexual partners and uses condoms some times. He reports 9 lifetime sexual partners. He denies rash, genital sores, urinary symptoms, fever, and testicular pain.     Functional Health Screening:        I reviewed and confirmed the patient's past medical history taken by the nurse or medical assistant with the addition of the following:    Past Medical History:    History reviewed. No pertinent past medical history.    Past Surgical History:    History reviewed. No pertinent surgical history.    Allergies:  Allergies   Allergen Reactions    Latex Hives/ Urticaria     Medications:    Current Outpatient Medications   Medication Sig    hydrocortisone 2.5 % Ointment Apply topically Twice per day as needed To rash on face (Patient not taking: Reported on 07/18/2022)    triamcinolone acetonide (ARISTOCORT A) 0.1 % Ointment Apply topically Twice per day as needed (Patient not taking: Reported on 07/18/2022)     Social History:    Social History     Tobacco Use    Smoking status: Never    Smokeless tobacco: Never   Substance Use Topics    Alcohol use: Yes     Alcohol/week: 2.0 standard drinks of alcohol     Types: 2 Shots of liquor per week    Drug use: Never     Family History:  Family Medical History:    None       Review of Systems:    General: no fever  Genitourinary: no genital sores, no urinary symptoms, no testicular pain  Skin: no rash  All other review of systems were negative    Physical Exam:  Vital signs:   Vitals:     08/20/22 1823   BP: (!) 149/92   Pulse: 70   Resp: 18   Temp: 36.5 C (97.7 F)   TempSrc: Tympanic   SpO2: 99%   Weight: 88.5 kg (195 lb 1.7 oz)   Height: 1.829 m (6')   BMI: 26.52     Body mass index is 26.46 kg/m. Facility age limit for growth %iles is 20 years.  No LMP for male patient.    General:  Well appearing and no acute distress  Head:  Normocephalic  Eyes:  Normal lids/lashes and normal conjunctiva  Neck:  Supple  Pulmonary:  Clear to auscultation bilaterally, no wheezes, no rales and no rhonchi  Cardiovascular:  Regular rate/rhythm, normal S1/S2 and no murmur/rub/gallop  Musculoskeletal: FROM of all extremities   Skin:  Warm/dry and no rash  Psychiatric:  Appropriate affect and behavior  Neurologic:   Alert and oriented x 3    Data Reviewed:    Labs: STI screening for chlamydia, gonorrhea, HIV, hepatitis C, and syphilis     Course: Condition at discharge: Good  Differential Diagnosis: STI screening    Assessment:   Assessment/Plan   1. Routine screening for STI (sexually transmitted infection)      Plan:    Orders Placed This Encounter    CHLAMYDIA TRACHOMATIS/NEISSERIA GONORRHOEAE RNA, NAAT    HIV1/HIV2 SCREEN, COMBINED ANTIGEN AND ANTIBODY    SYPHILIS SCREENING ALGORITHM WITH REFLEX, SERUM    HEPATITIS C ANTIBODY SCREEN WITH REFLEX TO HCV PCR     - Chlamydia, Trachomatis/Neisseria, Gonorrhea, RNA, and NAAT tests administered in clinic and sent to lab. Will follow up with results  - HIV1/HIV2 antigen and antibody, Hepatitis C antibody, and syphilis tests administered in clinic and sent to lab. Will follow up with results   - Educated on symptomatic treatment with OTC medications and remedies.  - Advised to follow up as needed for worsening or persisting symptoms.     Go to Emergency Department immediately for further work up if any concerning symptoms.  Plan was discussed and patient verbalized understanding. If symptoms are worsening or not improving the patient should return to the Student  [facility]/Urgent Care for further evaluation.    I am scribing for, and in the presence of, Lianne Bushy, Hershal Coria, for services provided on 08/20/2022.  Gwenevere Ghazi, SCRIBE   Gwenevere Ghazi, SCRIBE 08/20/2022, 18:26    Parts of this patient's chart may be completed in a retrospective fashion due to simultaneous direct patient care activities in the Emergency Department.   This note was partially generated using [employer] Fluency Direct system, and there may be some incorrect words, spellings, and punctuation that were not noted in checking the note before saving.      I personally performed the services described in this documentation, as scribed  in my presence, and it is both accurate  and complete.      Toney Rakes, PA-C  The  faculty was physically present in [place] and available for consultation and did not particpate in the care of this patient.@  Toney Rakes, PA-C  08/20/2022, 18:35

## 2022-08-21 LAB — CHLAMYDIA TRACHOMATIS/NEISSERIA GONORRHOEAE RNA, NAAT
CHLAMYDIA TRACHOMATIS RNA: NEGATIVE
NEISSERIA GONORRHEA GC RNA: NEGATIVE

## 2022-10-15 ENCOUNTER — Other Ambulatory Visit: Payer: Self-pay

## 2022-10-15 ENCOUNTER — Other Ambulatory Visit: Payer: MEDICAID | Attending: Emergency Medicine | Admitting: Emergency Medicine

## 2022-10-15 ENCOUNTER — Ambulatory Visit (INDEPENDENT_AMBULATORY_CARE_PROVIDER_SITE_OTHER): Payer: MEDICAID

## 2022-10-15 ENCOUNTER — Encounter (INDEPENDENT_AMBULATORY_CARE_PROVIDER_SITE_OTHER): Payer: Self-pay

## 2022-10-15 VITALS — BP 133/94 | HR 80 | Temp 97.4°F | Resp 18 | Ht 72.0 in | Wt 201.1 lb

## 2022-10-15 DIAGNOSIS — N342 Other urethritis: Secondary | ICD-10-CM

## 2022-10-15 DIAGNOSIS — R300 Dysuria: Secondary | ICD-10-CM | POA: Insufficient documentation

## 2022-10-15 LAB — POC URINALYSIS (RESULTS)
BILIRUBIN: NEGATIVE mg/dL
BLOOD: NEGATIVE mg/dL
GLUCOSE: NEGATIVE mg/dL
KETONES: NEGATIVE mg/dL
LEUKOCYTES: NEGATIVE WBCs/uL
NITRITE: NEGATIVE
PH: 7 (ref 5.0–8.0)
PROTEIN: 30 mg/dL — AB
SPECIFIC GRAVITY: 1.02 (ref 1.005–1.030)
UROBILINOGEN: 1 mg/dL

## 2022-10-15 MED ORDER — DOXYCYCLINE HYCLATE 100 MG CAPSULE
100.0000 mg | ORAL_CAPSULE | Freq: Two times a day (BID) | ORAL | 0 refills | Status: AC
Start: 2022-10-15 — End: 2022-10-22

## 2022-10-15 NOTE — Progress Notes (Signed)
History of Present Illness: Rick Grant is a 27 y.o. male who presents to the Student [facility]Urgent [facility] today with chief complaint of    Chief Complaint              STD Screening Burning with urination, just wants urine GC          27 y.o. male presents to [facility] with c/o dysuria onset one week ago.   Patient has   new onset of dysuria  Patient does not  exposure to STI  Known exposure to no known diseases.   Patient has history of  no prior STI's..   Last sexual contact 2Weeks ago with 1 partners.  Was this contact consensual? yes   Contact protected: no                 I reviewed and confirmed the patient's past medical history taken by the nurse or medical assistant with the addition of the following:    Review of Systems:    ENT: no sore throat  Gastrointestinal: no abdominal pain  Genitourinary: dysuria and no testicular pain  All other review of systems were negative.    Functional Health Screening:        Past Medical History:    History reviewed. No pertinent past medical history.  Past Surgical History:    History reviewed. No pertinent surgical history.  Allergies:  Allergies   Allergen Reactions    Latex Hives/ Urticaria     Medications:    Current Outpatient Medications   Medication Sig    doxycycline hyclate (VIBRAMYCIN) 100 mg Oral Capsule Take 1 Capsule (100 mg total) by mouth Twice daily for 7 days    hydrocortisone 2.5 % Ointment Apply topically Twice per day as needed To rash on face (Patient not taking: Reported on 07/18/2022)    triamcinolone acetonide (ARISTOCORT A) 0.1 % Ointment Apply topically Twice per day as needed (Patient not taking: Reported on 07/18/2022)     Social History:    Social History     Tobacco Use    Smoking status: Never    Smokeless tobacco: Never   Vaping Use    Vaping Use: Never used   Substance Use Topics    Alcohol use: Not Currently     Alcohol/week: 2.0 standard drinks of alcohol     Types: 2 Shots of liquor per week    Drug use: Never     Family  History:  Family Medical History:    None           Physical Exam:  Vital signs:   Vitals:    10/15/22 1452   BP: (!) 133/94   Pulse: 80   Resp: 18   Temp: 36.3 C (97.4 F)   SpO2: 99%   Weight: 91.2 kg (201 lb 1 oz)   Height: 1.829 m (6')   BMI: 27.33         Body mass index is 27.27 kg/m. Facility age limit for growth %iles is 20 years.  No LMP for male patient.    General:  Well appearing and No acute distress  Gastrointestinal:  Non-distended, normal bowel sounds, soft, non-tender, no rebound, no guarding, no hepatosplenomegaly   Genito-urinary:  NO CVAT   Skin:  warm/dry and no rash        Data Reviewed:    Results for orders placed or performed in visit on 10/15/22 (from the past 12 hour(s))   POC URINALYSIS (RESULTS)  Result Value Ref Range    COLOR Yellow Yellow    CLARITY Clear Clear    GLUCOSE Negative Negative mg/dL    BILIRUBIN Negative Negative mg/dL    KETONES Negative Negative mg/dL    SPECIFIC GRAVITY 1.020 1.005 - 1.030    BLOOD Negative Negative mg/dL    PH 7.0 5.0 - <8.0    PROTEIN 30 (A) Negative, Trace mg/dL    UROBILINOGEN 1.0 0.2 , 1.0 mg/dL    NITRITE Negative Negative    LEUKOCYTES Negative Negative WBCs/uL         Course: Condition at discharge: Good     Differential Diagnosis: Chlamydia vs Gonnorhea vs UTI    Assessment:   Assessment/Plan   1. Urethritis    2. Dysuria        Plan:    Orders Placed This Encounter    CHLAMYDIA TRACHOMATIS/NEISSERIA GONORRHOEAE RNA, NAAT    POCT AUTOMATED RACK URINE WITHOUT MICROSCOPY    doxycycline hyclate (VIBRAMYCIN) 100 mg Oral Capsule      Prescription(s) given for Vibramycin given directions on proper use.  POCT Urinalysis  performed in clinic, see data reviewed for results.   Chlamydia/Gonnorhea RNA NAAT sent to lab. Will follow up with results.  Advised to follow up as needed for worsening or persisting symptoms.        Go to Emergency Department immediately for further work up if any concerning symptoms.  Plan was discussed and patient verbalized  understanding. If symptoms are worsening or not improving the patient should return to the Student Health/Urgent Care for further evaluation.    I am scribing for, and in the presence of, Dr. Guy Franco for services provided on 10/15/2022.  Marlane Hatcher, SCRIBE   Marlane Hatcher, SCRIBE 10/15/2022, 15:05    I personally performed the services described in this documentation, as scribed  in my presence, and it is both accurate  and complete.    Guy Franco, DO

## 2022-10-16 LAB — CHLAMYDIA TRACHOMATIS/NEISSERIA GONORRHOEAE RNA, NAAT
CHLAMYDIA TRACHOMATIS RNA: NEGATIVE
NEISSERIA GONORRHEA GC RNA: NEGATIVE

## 2023-02-25 ENCOUNTER — Other Ambulatory Visit: Payer: Self-pay

## 2023-02-25 ENCOUNTER — Ambulatory Visit (INDEPENDENT_AMBULATORY_CARE_PROVIDER_SITE_OTHER): Payer: MEDICAID

## 2023-02-25 ENCOUNTER — Encounter (INDEPENDENT_AMBULATORY_CARE_PROVIDER_SITE_OTHER): Payer: Self-pay

## 2023-02-25 VITALS — BP 126/86 | HR 98 | Temp 98.2°F | Resp 18 | Ht 72.0 in | Wt 196.7 lb

## 2023-02-25 DIAGNOSIS — Z8709 Personal history of other diseases of the respiratory system: Secondary | ICD-10-CM

## 2023-02-25 DIAGNOSIS — R079 Chest pain, unspecified: Secondary | ICD-10-CM

## 2023-02-25 DIAGNOSIS — J90 Pleural effusion, not elsewhere classified: Secondary | ICD-10-CM

## 2023-02-25 LAB — ECG 12 LEAD - (AMB USE ONLY)(MUSE, IN CLINIC)
Atrial Rate: 78 {beats}/min
Calculated P Axis: 58 degrees
Calculated R Axis: 68 degrees
Calculated T Axis: 54 degrees
PR Interval: 144 ms
QRS Duration: 90 ms
QT Interval: 358 ms
QTC Calculation: 408 ms
Ventricular rate: 78 {beats}/min

## 2023-02-25 MED ORDER — KETOROLAC 60 MG/2 ML INTRAMUSCULAR SOLUTION
60.0000 mg | Freq: Once | INTRAMUSCULAR | Status: AC
Start: 2023-02-25 — End: 2023-02-25
  Administered 2023-02-25: 60 mg via INTRAMUSCULAR

## 2023-02-25 NOTE — Progress Notes (Signed)
error 

## 2023-02-25 NOTE — Progress Notes (Signed)
Attending physician: Dr. Lilian Kapur  History of Present Illness: Rick Grant is a 27 y.o. male who presents to the Urgent Care/Student [facility] today with chief complaint of    Chief Complaint              Shortness of Breath Hx lung collapse, chest pain            27 y.o. male presents to Urgent Care/Student [facility] with c/o dyspnea onset 2 weeks ago. Pt reports his dyspnea has been ongoing for 2 weeks then woke up 2 days ago with chest tightness. Pt states he feels a "sharp pain" in his L chest when he takes a deep breath. 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 Pt says he was seen at an external Urgent Care recently for his sxs and had an EKG performed which showed slightly high T waves and x-ray imaging which returned unremarkable. Pt associates chest tightness.     I reviewed and confirmed the patient's past medical history taken by the nurse or medical assistant with the addition of the following:    Past Medical History:    History reviewed. No pertinent past medical history.    Past Surgical History:    History reviewed. No pertinent surgical history.    Allergies:  Allergies   Allergen Reactions    Latex Hives/ Urticaria     Medications:    Current Outpatient Medications   Medication Sig    hydrocortisone 2.5 % Ointment Apply topically Twice per day as needed To rash on face (Patient not taking: Reported on 07/18/2022)    triamcinolone acetonide (ARISTOCORT A) 0.1 % Ointment Apply topically Twice per day as needed (Patient not taking: Reported on 07/18/2022)     Social History:    Social History     Tobacco Use    Smoking status: Never    Smokeless tobacco: Never   Vaping Use    Vaping status: Never Used   Substance Use Topics    Alcohol use: Not Currently     Alcohol/week: 2.0 standard drinks of alcohol     Types: 2 Shots of liquor per week    Drug use: Never     Family History:  Family Medical History:    None       Review of Systems:    Pulmonary: +dyspnea  Cardiovascular:  +chest pain  All other review of systems were negative    Physical Exam:  Vital signs:   Vitals:    02/25/23 1504   BP: 126/86   Pulse: 98   Resp: 18   Temp: 36.8 C (98.2 F)   SpO2: 97%   Weight: 89.2 kg (196 lb 10.4 oz)   Height: 1.829 m (6')   BMI: 26.73     Body mass index is 26.67 kg/m. Facility age limit for growth %iles is 20 years.  No LMP for male patient.    General:  Well appearing and No acute distress  Head:  Normocephalic and atraumatic  Eyes:  Normal lids/lashes, normal conjunctiva  ENT:  normal EAC's, normal TM's, MMM, normal pharynx/tonsils, normal tongue/uvula  Neck:  supple  Pulmonary:  clear to auscultation bilaterally, no wheezes, no rales and no rhonchi  Cardiovascular:  Mildly tachycardic, normal S1/S2, no murmur/rub/gallop  Musculoskeletal:  5/5 strength in all extremities  Skin:  warm/dry and no rash  Psychiatric:  Appropriate affect and behavior and Normal speech  Neurologic:   Alert and oriented x 3  Hem/Lymph:  No cervical lymphadenopathy    Data Reviewed:    Radiography:  PA and Lateral Chest x-ray  Reviewed by Mackie Pai APRN,NP-C on 02/25/2023 at 15:33.   No pneumothorax      Electrocardiography:  ECG 12 LEAD  Reviewed by Mackie Pai APRN,NP-C on 02/25/2023 at 15:19. Sinus rhythm rate of 78    Course/MDM: Condition at discharge: Good     Differential Diagnosis: Pericarditis vs Pneumothorax vs Anxiety    Assessment:   Assessment/Plan   1. Chest pain, unspecified type    2. Hx of pneumothorax      Plan:    Orders Placed This Encounter    CXR PA & Lateral    ECG 12 LEAD (In UC - Performed Same Day)    ketorolac (TORADOL) 60mg /2 mL IM injection     Pt was administered Toradol in clinic, pt tolerated well.  PA and Lateral Chest x-ray performed in clinic, see data reviewed. Staff read trace R pleural effusion.   ECG 12 LEAD performed in clinic, see data reviewed.  Educated on symptomatic treatment with OTC medications and remedies.  Advised pt to go to ED immediately if sxs worsen or  persist.    Go to Emergency Department immediately for further work up if any concerning symptoms.  Plan was discussed and patient verbalized understanding.  If symptoms are worsening or not improving the patient should return to the Urgent Care for further evaluation.    I am scribing for, and in the presence of, Mackie Pai [facility] for services provided on 02/25/2023.  Gust Brooms, SCRIBE   Alex Jayton, SCRIBE 02/25/2023, 15:08    The co-signing faculty was physically present and available for consultation and did not participate in the care of this patient.    I personally performed the services described in this documentation, as scribed  in my presence, and it is both accurate  and complete.    Mackie Pai, APRN,NP-C  Mackie Pai, APRN,NP-C  02/25/2023, 16:32

## 2023-03-10 ENCOUNTER — Other Ambulatory Visit (INDEPENDENT_AMBULATORY_CARE_PROVIDER_SITE_OTHER): Payer: Self-pay

## 2023-03-10 ENCOUNTER — Encounter (INDEPENDENT_AMBULATORY_CARE_PROVIDER_SITE_OTHER): Payer: Self-pay

## 2023-03-10 DIAGNOSIS — Z7189 Other specified counseling: Secondary | ICD-10-CM

## 2023-03-10 NOTE — Nursing Note (Signed)
Population Health  Attribution Verification   DATE: 03/10/2023, 14:47  PATIENT NAME: Rick Grant  MRN: [id number]  DOB: 01/26/1996  PCP: No Pcp  INSURANCE: Payor: [employer] - [place] / Plan: [employer] BETTER [employer] - [place] / Product Type: Medicaid [place] /     REASON FOR ENCOUNTER:   According to the patient's insurance records, they are attributed to a [facility] Primary Care Physician (PCP) for their healthcare needs.     Outreach made to the patient to clarify their choice of PCP, to either assist with arranging a PCP appointment or attempt to remove the patient from [facility] patient attribution. Electronic Health Record care team will be updated with accurate PCP.     Contact Attempt:   Contact attempt 1 made to patient via MyChart message    Outcome:  Sent patient MyChart message      No future appointments.    Health Maintenance Due   Topic Date Due    NonMedicare Preventative Exam  Never done    Depression Screening  Never done    Hepatitis B Vaccine (2 of 3 - 19+ 3-dose series) 02/01/2020    Covid-19 Vaccine (1 - 2023-24 season) Never done       Encouraged patient to contact their insurance's member services to update their selected PCP.      Bing Matter, PATIENT NAVIGATOR  03/10/2023, 14:47  Patient Navigator, Population Health

## 2023-06-30 ENCOUNTER — Other Ambulatory Visit: Payer: Self-pay

## 2023-06-30 ENCOUNTER — Ambulatory Visit (INDEPENDENT_AMBULATORY_CARE_PROVIDER_SITE_OTHER): Payer: MEDICAID

## 2023-06-30 DIAGNOSIS — Z111 Encounter for screening for respiratory tuberculosis: Secondary | ICD-10-CM

## 2023-06-30 NOTE — Nursing Note (Signed)
Immunization administered       Name Date Dose VIS Date Route    PPD 06/30/2023 0.1 mL N/A Intradermal    Site: Left arm    Given By: Velora Mediate, LPN    Manufacturer: [employer]    Lot: [id number]    NDC: [id number]

## 2023-06-30 NOTE — Patient Instructions (Signed)
Tuberculin purified protein derivative, PPD injection   What is this medicine?   TUBERCULIN PURIFIED PROTEIN DERIVATIVE, PPD is a test used to detect if you have a tuberculosis infection. It will not cause tuberculosis infection.   This medicine may be used for other purposes; ask your health care provider or pharmacist if you have questions.   COMMON BRAND NAME(S): Aplisol, Tubersol     What should I tell my health care provider before I take this medicine?   They need to know if you have any of these conditions:   -diabetes   -HIV or AIDS   -immune system problems   -infection (especially a virus infection such as chickenpox, cold sores, or herpes)   -kidney disease   -malnutrition   -an unusual or allergic reaction to tuberculin purified protein derivative, PPD, other medicines, foods, dyes, or preservatives   -pregnant or trying to get pregnant   -breast-feeding     How should I use this medicine?   This medicine is for injection in the skin. It is given by a health care professional in a hospital or clinic setting.   Talk to your pediatrician regarding the use of this medicine in children. While this drug may be prescribed in children, precautions do apply.   Overdosage: If you think you've taken too much of this medicine contact a poison control center or emergency room at once.   Overdosage: If you think you have taken too much of this medicine contact a poison control center or emergency room at once.   NOTE: This medicine is only for you. Do not share this medicine with others.     What if I miss a dose?   It is important not to miss your dose. Call your doctor or health care professional if you are unable to keep an appointment.     What may interact with this medicine?   -adalimumab   -anakinra   -etanercept   -infliximab   -live virus vaccines   -medicines to treat cancer   -steroid medicines like prednisone or cortisone   This list may not describe all possible interactions. Give your health care  provider a list of all the medicines, herbs, non-prescription drugs, or dietary supplements you use. Also tell them if you smoke, drink alcohol, or use illegal drugs. Some items may interact with your medicine.     What should I watch for while using this medicine?   See your health care provider as directed.   This medicine does not protect against tuberculosis.     What side effects may I notice from receiving this medicine?   Side effects that you should report to your doctor or health care professional as soon as possible:   -allergic reactions like skin rash, itching or hives, swelling of the face, lips, or tongue   -breathing problems   Side effects that usually do not require medical attention (Report these to your doctor or health care professional if they continue or are bothersome.):   -bruising   -pain, redness, or irritation at site where injected   This list may not describe all possible side effects. Call your doctor for medical advice about side effects. You may report side effects to FDA at 1-800-FDA-1088.     Where should I keep my medicine?   This drug is given in a hospital or clinic and will not be stored at home.   NOTE: This sheet is a summary. It may not cover all   possible information. If you have questions about this medicine, talk to your doctor, pharmacist, or health care provider.    2013, Elsevier/Gold Standard. (11/16/2009 1:24:18 PM)

## 2023-07-02 LAB — POCT PPD READING

## 2024-01-11 ENCOUNTER — Encounter (INDEPENDENT_AMBULATORY_CARE_PROVIDER_SITE_OTHER): Payer: Self-pay

## 2024-01-11 ENCOUNTER — Ambulatory Visit (INDEPENDENT_AMBULATORY_CARE_PROVIDER_SITE_OTHER): Payer: MEDICAID | Admitting: PHYSICIAN ASSISTANT

## 2024-01-11 ENCOUNTER — Other Ambulatory Visit: Payer: Self-pay

## 2024-01-11 VITALS — BP 135/63 | HR 84 | Temp 97.7°F | Resp 18 | Ht 72.0 in | Wt 207.9 lb

## 2024-01-11 DIAGNOSIS — R058 Other specified cough: Secondary | ICD-10-CM

## 2024-01-11 NOTE — Progress Notes (Signed)
 Attending physician: Dr. Alcus Dad  History of Present Illness: Rick Grant is a 28 y.o. male who presents to the Urgent Care today with chief complaint of    Chief Complaint              Sore Throat     Cough           Pt presents to Urgent Care with a dry cough that started "a few weeks" ago. Pt reports that he did have a sore throat and fever a few weeks ago, which completely resolved, but states that his cough has lingered. He notes that he has no chronic medical problems. He is concerned because his friend had Strep in the past which developed into pneumonia. He has not taken any OTC medications for his symptoms. Pt denies SOB and nasal congestion.     I reviewed and confirmed the patient's past medical history taken by the nurse or medical assistant with the addition of the following:    Past Medical History:    History reviewed. No pertinent past medical history.  Past Surgical History:   Past Surgical History:   Procedure Laterality Date    Pleural scarification Right      Allergies:  Allergies   Allergen Reactions    Latex Hives/ Urticaria     Medications:    Current Outpatient Medications   Medication Sig    hydrocortisone 2.5 % Ointment Apply topically Twice per day as needed To rash on face (Patient not taking: Reported on 07/18/2022)    triamcinolone acetonide (ARISTOCORT A) 0.1 % Ointment Apply topically Twice per day as needed (Patient not taking: Reported on 07/18/2022)     Social History:    Social History     Tobacco Use    Smoking status: Never    Smokeless tobacco: Never   Vaping Use    Vaping status: Never Used   Substance Use Topics    Alcohol use: Not Currently     Alcohol/week: 2.0 standard drinks of alcohol     Types: 2 Shots of liquor per week    Drug use: Never     Family History:  Family Medical History:    None       Review of Systems:    General: no fever  ENT: no sore throat, no nasal congestion  Pulmonary: +dry cough, no SOB     All other review of systems were negative    Physical  Exam:  Vital signs:   Vitals:    01/11/24 1234   BP: 135/63   Pulse: 84   Resp: 18   Temp: 36.5 C (97.7 F)   TempSrc: Tympanic   SpO2: 95%   Weight: 94.3 kg (207 lb 14.3 oz)   Height: 1.829 m (6')   BMI: 28.2     Body mass index is 28.2 kg/m. Facility age limit for growth %iles is 20 years.  No LMP for male patient.    General:  Well appearing and No acute distress  ENT:  normal EAC's, normal TM's, MMM, normal pharynx/tonsils, and normal tongue/uvula  Pulmonary:  clear to auscultation bilaterally, no wheezes, no rales and no rhonchi  Cardiovascular:  regular rate/rhythm, normal S1/S2, no murmur/rub/gallop  Skin:  warm/dry and no rash  Psychiatric:  Appropriate affect and behavior and Normal speech  Neurologic:   Alert and oriented x 3  Hem/Lymph:  No cervical lymphadenopathy    Data Reviewed:    No results found for this or any previous  visit (from the past 12 hours).     Course: Condition at discharge: Good     Differential Diagnosis: Post-Viral Cough Syndrome    Assessment:   Assessment/Plan   1. Post-viral cough syndrome        Plan:  No orders of the defined types were placed in this encounter.    Symptomatic management with OTC medications and home remedies.  Advised to follow up as needed for worsening or persisting symptoms.    Go to Emergency Department immediately for further work up if any concerning symptoms.  Plan was discussed and patient verbalized understanding. If symptoms are worsening or not improving the patient should return to the Urgent Care for further evaluation.    I am scribing for, and in the presence of, Rick Grant, [place], for services provided on 01/11/2024.  Valene Bors, SCRIBE   John Strzelec, SCRIBE 01/11/2024, 12:47

## 2024-05-11 ENCOUNTER — Other Ambulatory Visit: Payer: MEDICAID

## 2024-05-11 ENCOUNTER — Ambulatory Visit (INDEPENDENT_AMBULATORY_CARE_PROVIDER_SITE_OTHER): Payer: MEDICAID

## 2024-05-11 ENCOUNTER — Encounter (INDEPENDENT_AMBULATORY_CARE_PROVIDER_SITE_OTHER): Payer: Self-pay

## 2024-05-11 ENCOUNTER — Other Ambulatory Visit: Payer: Self-pay

## 2024-05-11 VITALS — BP 148/94 | HR 78 | Temp 98.4°F | Resp 16 | Wt 213.8 lb

## 2024-05-11 DIAGNOSIS — Z113 Encounter for screening for infections with a predominantly sexual mode of transmission: Secondary | ICD-10-CM | POA: Insufficient documentation

## 2024-05-11 LAB — HEPATITIS B SURFACE ANTIBODY: HBV SURFACE ANTIBODY QUANTITATIVE: 8.73 m[IU]/mL — ABNORMAL HIGH (ref <8.00)

## 2024-05-11 LAB — HEPATITIS B SURFACE ANTIGEN: HBV SURFACE ANTIGEN QUALITATIVE: NEGATIVE

## 2024-05-11 LAB — HEPATITIS C ANTIBODY SCREEN WITH REFLEX TO HCV PCR: HCV ANTIBODY QUALITATIVE: NEGATIVE

## 2024-05-11 LAB — HIV1/HIV2 SCREEN, COMBINED ANTIGEN AND ANTIBODY: HIV SCREEN, COMBINED ANTIGEN & ANTIBODY: NEGATIVE

## 2024-05-11 LAB — SYPHILIS SCREENING ALGORITHM WITH REFLEX, SERUM: SYPHILIS TP ANTIBODIES: NONREACTIVE

## 2024-05-11 NOTE — Progress Notes (Signed)
 [facility] URGENT CARE  Urgent Care Clinic, [facility]  [address]  [place], [place] [address]    PATIENT NAME:  Rick Grant  MRN:  [id number]  DOB:  May 27, 1996  DATE OF SERVICE:  05/11/2024    History of Present Illness:  Rick Grant is a 28 y.o. male who presents to [facility] today with chief complaint of:   Chief Complaint              STI Screening           Location:  Genitourinary  Quality:  STI screening  Context:  Pt sts he recently had a new male sexual partner after a past relationship ended. He requests routine STI screening this visit. Pt sts that he is asymptomatic today and does not express concern for exposure to STI. Pt notes he was in his last relationship for a couple months and did not use protection. Patient denies being sexually active with same sex. Denies any acute complaints today.          Review Flowsheet  More data may exist         05/11/2024   FUNCTIONAL HEALTH SCREENING   Have you had a recent unexplained weight loss or gain? N   Because we are aware of abuse and domestic violence today, we ask all patients: Are you being hurt, hit, or frightened by anyone at your home or in your life?  N   Do you have any basic needs within your home that are not being met? (such as Food, Shelter, [employer] fast streamer, Tranportation, paying for bills and/or medications) N     PHQ Questionnaire:     Fall Risk Assessment     I reviewed and confirmed the patient's past medical history taken by the nurse or medical assistant with the addition of the following:    Past Medical History:    History reviewed. No pertinent past medical history.  Past Surgical History:    Past Surgical History:   Procedure Laterality Date    Pleural scarification Right      Allergies:  Allergies[1]  Medications:    No current outpatient medications on file.     Social History:    Social History     Socioeconomic History    Marital status: Single     Spouse name: Not on file    Number of children: Not on file     Years of education: Not on file    Highest education level: Not on file   Occupational History    Occupation: dentist   Tobacco Use    Smoking status: Never    Smokeless tobacco: Never   Vaping Use    Vaping status: Never Used   Substance and Sexual Activity    Alcohol use: Not Currently     Alcohol/week: 0.0 - 2.0 standard drinks of alcohol    Drug use: Never    Sexual activity: Not on file   Other Topics Concern    Not on file   Social History Narrative    Not on file     Social Determinants of Health     Financial Resource Strain: Not on file   Transportation Needs: Not on file   Social Connections: Unknown (02/15/2022)    Received from [facility]    Social Network     Social Network: Not on file   Intimate Partner Violence: Unknown (01/07/2022)    Received from [facility]  HITS     Physically Hurt: Not on file     Insult or Talk Down To: Not on file     Threaten Physical Harm: Not on file     Scream or Curse: Not on file   Housing Stability: Not on file     Family History:    Family History   Problem Relation Age of Onset    Healthy Mother     Healthy Father     No Known Problems Maternal Grandmother     No Known Problems Maternal Grandfather     No Known Problems Paternal Grandmother     No Known Problems Paternal Grandfather      Review of Systems:    Review of Systems   Constitutional:  Negative for fatigue, fever and unexpected weight change.   HENT:  Negative for sinus pain and sore throat.    Respiratory:  Negative for cough, chest tightness and shortness of breath.    Cardiovascular:  Negative for chest pain.   Gastrointestinal:  Negative for abdominal pain, diarrhea and vomiting.   Genitourinary:  Negative for dysuria, genital sores, penile discharge and penile pain.   Skin:  Negative for rash.   Neurological:  Negative for headaches.   Psychiatric/Behavioral:  Negative for self-injury. The patient is not nervous/anxious.      Physical Exam:  Vital signs:   Vitals:    05/11/24 1337   BP: (!) 148/94    Pulse: 78   Resp: 16   Temp: 36.9 C (98.4 F)   TempSrc: Oral   SpO2: 96%   Weight: 97 kg (213 lb 13.5 oz)         Body mass index is 29 kg/m. Facility age limit for growth %iles is 20 years.  No LMP for male patient.    Physical Exam  Vitals and nursing note reviewed.   Constitutional:       General: He is not in acute distress.     Appearance: He is well-developed. He is not diaphoretic.   HENT:      Head: Normocephalic and atraumatic.   Eyes:      General:         Right eye: No discharge.         Left eye: No discharge.      Conjunctiva/sclera: Conjunctivae normal.   Cardiovascular:      Rate and Rhythm: Normal rate.   Pulmonary:      Effort: Pulmonary effort is normal.      Breath sounds: No stridor.   Musculoskeletal:         General: No tenderness. Normal range of motion.      Cervical back: Normal range of motion.   Skin:     General: Skin is warm and dry.   Neurological:      Mental Status: He is alert and oriented to person, place, and time.   Psychiatric:         Behavior: Behavior normal.        Data Reviewed:  Labs: Chlamydia, Gonorrhea, HIV, Hep C, Syphilis, and Hep B  No results found for this or any previous visit (from the past 12 hours).  Course:  Condition at discharge:  Good    Differential Diagnosis:  routine STI screening    Assessment:   Assessment/Plan   1. Routine screening for STI (sexually transmitted infection)      Plan:    Orders Placed This Encounter    CHLAMYDIA TRACHOMATIS/NEISSERIA  GONORRHOEAE RNA, NAAT    HIV1/HIV2 SCREEN, COMBINED ANTIGEN AND ANTIBODY    HEPATITIS C ANTIBODY SCREEN WITH REFLEX TO HCV PCR    SYPHILIS SCREENING ALGORITHM WITH REFLEX, SERUM    Hepatitis B Surface Antibody    HEPATITIS B SURFACE ANTIGEN      - Labs collected for Chlamydia, Gonorrhea, HIV, Hep C, Syphilis, and Hep B; will follow up with results    Advised patient/patient's guardian(s) to go to the Emergency Department immediately for further work up, if any concerning symptoms. If symptoms are  worsening or not improving, the patient should return to [facility] for further evaluation. Plan was discussed and patient/patient's guardian(s) verbalized understanding.      I am scribing for, and in the presence of, Dr. Veryl Aurora for services provided on 05/11/2024.  Lyle Belarus, 13:53      Veryl Aurora, MD   Department of Emergency Medicine   [place]  [place]         [1]   Allergies  Allergen Reactions    Latex Hives/ Urticaria

## 2024-05-11 NOTE — Nursing Note (Signed)
Labs collected from right ac on first attempt. Patient tolerated. 2 gold tubes and 1 tall purple tube collected. Bandage secured to site.Judeen Hammans, RN

## 2024-05-11 NOTE — Nursing Note (Signed)
 Patient has given verbal permission for the scribe to assist the healthcare provider during the patient's visit.Rick Grant [place], [place] 05/11/2024, 13:36

## 2024-05-12 LAB — CHLAMYDIA TRACHOMATIS/NEISSERIA GONORRHOEAE RNA, NAAT
CHLAMYDIA TRACHOMATIS RNA: NEGATIVE
NEISSERIA GONORRHEA GC RNA: NEGATIVE
